# Patient Record
Sex: Male | Born: 1980 | Race: White | Hispanic: No | Marital: Married | Smoking: Never smoker
Health system: Southern US, Community
[De-identification: ages and names within clinical notes are randomized; demographics above are authoritative.]

## PROBLEM LIST (undated history)

## (undated) DIAGNOSIS — M419 Scoliosis, unspecified: Secondary | ICD-10-CM

## (undated) DIAGNOSIS — Z91018 Allergy to other foods: Secondary | ICD-10-CM

## (undated) DIAGNOSIS — J302 Other seasonal allergic rhinitis: Secondary | ICD-10-CM

## (undated) DIAGNOSIS — J45909 Unspecified asthma, uncomplicated: Secondary | ICD-10-CM

## (undated) HISTORY — PX: TYMPANOSTOMY TUBE PLACEMENT: SHX32

## (undated) HISTORY — PX: BACK SURGERY: SHX140

## (undated) HISTORY — PX: WISDOM TOOTH EXTRACTION: SHX21

---

## 2014-01-21 ENCOUNTER — Encounter (HOSPITAL_COMMUNITY): Payer: Self-pay | Admitting: Emergency Medicine

## 2014-01-21 ENCOUNTER — Emergency Department (HOSPITAL_COMMUNITY)
Admission: EM | Admit: 2014-01-21 | Discharge: 2014-01-21 | Disposition: A | Discharge status: Home / Self Care | Payer: BC Managed Care – PPO | Attending: Emergency Medicine | Admitting: Emergency Medicine

## 2014-01-21 DIAGNOSIS — Y929 Unspecified place or not applicable: Secondary | ICD-10-CM | POA: Insufficient documentation

## 2014-01-21 DIAGNOSIS — X088XXA Exposure to other specified smoke, fire and flames, initial encounter: Secondary | ICD-10-CM | POA: Insufficient documentation

## 2014-01-21 DIAGNOSIS — Z79899 Other long term (current) drug therapy: Secondary | ICD-10-CM | POA: Insufficient documentation

## 2014-01-21 DIAGNOSIS — T23202A Burn of second degree of left hand, unspecified site, initial encounter: Secondary | ICD-10-CM

## 2014-01-21 DIAGNOSIS — Y9389 Activity, other specified: Secondary | ICD-10-CM | POA: Insufficient documentation

## 2014-01-21 DIAGNOSIS — T23209A Burn of second degree of unspecified hand, unspecified site, initial encounter: Secondary | ICD-10-CM | POA: Insufficient documentation

## 2014-01-21 DIAGNOSIS — J45909 Unspecified asthma, uncomplicated: Secondary | ICD-10-CM | POA: Insufficient documentation

## 2014-01-21 HISTORY — DX: Other seasonal allergic rhinitis: J30.2

## 2014-01-21 HISTORY — DX: Allergy to other foods: Z91.018

## 2014-01-21 HISTORY — DX: Scoliosis, unspecified: M41.9

## 2014-01-21 HISTORY — DX: Unspecified asthma, uncomplicated: J45.909

## 2014-01-21 MED ORDER — SILVER SULFADIAZINE 1 % EX CREA
TOPICAL_CREAM | Freq: Two times a day (BID) | CUTANEOUS | Status: DC
  Administered 2014-01-21: 05:00:00 via TOPICAL
  Filled 2014-01-21: qty 85

## 2014-01-21 MED ORDER — HYDROCODONE-ACETAMINOPHEN 5-325 MG PO TABS: 1.0000 | ORAL_TABLET | ORAL | Status: DC | PRN

## 2014-01-21 NOTE — Discharge Instructions (Signed)
Use topical antibiotics and lotion as discussed.  If you were given medicines take as directed.  If you are on coumadin or contraceptives realize their levels and effectiveness is altered by many different medicines.  If you have any reaction (rash, tongues swelling, other) to the medicines stop taking and see a physician.   Please follow up as directed and return to the ER or see a physician for new or worsening symptoms.  Thank you. Filed Vitals:   01/21/14 0102 01/21/14 0330  BP: 130/96 121/79  Pulse: 75 67  Temp: 98.6 F (37 C)   TempSrc: Oral   Resp: 18   SpO2: 99% 100%    Burn Care Burns hurt your skin. When your skin is hurt, it is easier to get an infection. Follow your doctor's directions to help prevent an infection. HOME CARE  Wash your hands well before you change your bandage.  Change your bandage as often as told by your doctor.  Remove the old bandage. If the bandage sticks, soak it off with cool, clean water.  Gently clean the burn with mild soap and water.  Pat the burn dry with a clean, dry cloth.  Put a thin layer of medicated cream on the burn.  Put a clean bandage on as told by your doctor.  Keep the bandage clean and dry.  Raise (elevate) the burn for the first 24 hours. After that, follow your doctor's directions.  Only take medicine as told by your doctor. GET HELP RIGHT AWAY IF:   You have too much pain.  The skin near the burn is red, tender, puffy (swollen), or has red streaks.  The burn area has yellowish white fluid (pus) or a bad smell coming from it.  You have a fever. MAKE SURE YOU:   Understand these instructions.  Will watch your condition.  Will get help right away if you are not doing well or get worse. Document Released: 05/23/2008 Document Revised: 11/06/2011 Document Reviewed: 01/04/2011 [facility] Patient Information 2014 [place], [place].
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
## 2017-04-09 DIAGNOSIS — J4530 Mild persistent asthma, uncomplicated: Secondary | ICD-10-CM | POA: Diagnosis not present

## 2017-06-18 DIAGNOSIS — R6889 Other general symptoms and signs: Secondary | ICD-10-CM | POA: Diagnosis not present

## 2017-06-18 DIAGNOSIS — Z20828 Contact with and (suspected) exposure to other viral communicable diseases: Secondary | ICD-10-CM | POA: Diagnosis not present

## 2017-06-18 DIAGNOSIS — J4530 Mild persistent asthma, uncomplicated: Secondary | ICD-10-CM | POA: Diagnosis not present

## 2017-07-24 DIAGNOSIS — Z23 Encounter for immunization: Secondary | ICD-10-CM | POA: Diagnosis not present

## 2017-09-17 DIAGNOSIS — J0100 Acute maxillary sinusitis, unspecified: Secondary | ICD-10-CM | POA: Diagnosis not present

## 2017-09-17 DIAGNOSIS — J209 Acute bronchitis, unspecified: Secondary | ICD-10-CM | POA: Diagnosis not present

## 2018-01-28 DIAGNOSIS — J309 Allergic rhinitis, unspecified: Secondary | ICD-10-CM | POA: Diagnosis not present

## 2018-01-28 DIAGNOSIS — Z0000 Encounter for general adult medical examination without abnormal findings: Secondary | ICD-10-CM | POA: Diagnosis not present

## 2018-01-28 DIAGNOSIS — E782 Mixed hyperlipidemia: Secondary | ICD-10-CM | POA: Diagnosis not present

## 2018-01-28 DIAGNOSIS — E041 Nontoxic single thyroid nodule: Secondary | ICD-10-CM | POA: Diagnosis not present

## 2018-01-28 DIAGNOSIS — L989 Disorder of the skin and subcutaneous tissue, unspecified: Secondary | ICD-10-CM | POA: Diagnosis not present

## 2018-01-30 ENCOUNTER — Other Ambulatory Visit: Payer: Self-pay | Admitting: Family Medicine

## 2018-01-30 DIAGNOSIS — E041 Nontoxic single thyroid nodule: Secondary | ICD-10-CM

## 2018-02-06 ENCOUNTER — Ambulatory Visit
Admission: RE | Admit: 2018-02-06 | Discharge: 2018-02-06 | Disposition: A | Discharge status: Home / Self Care | Payer: BLUE CROSS/BLUE SHIELD | Source: Ambulatory Visit | Attending: Family Medicine | Admitting: Family Medicine

## 2018-02-06 DIAGNOSIS — E041 Nontoxic single thyroid nodule: Secondary | ICD-10-CM | POA: Diagnosis not present

## 2019-02-13 DIAGNOSIS — J4530 Mild persistent asthma, uncomplicated: Secondary | ICD-10-CM | POA: Diagnosis not present

## 2019-02-13 DIAGNOSIS — J309 Allergic rhinitis, unspecified: Secondary | ICD-10-CM | POA: Diagnosis not present

## 2019-02-13 DIAGNOSIS — L989 Disorder of the skin and subcutaneous tissue, unspecified: Secondary | ICD-10-CM | POA: Diagnosis not present

## 2019-05-31 DIAGNOSIS — Z23 Encounter for immunization: Secondary | ICD-10-CM | POA: Diagnosis not present

## 2019-06-13 DIAGNOSIS — D485 Neoplasm of uncertain behavior of skin: Secondary | ICD-10-CM | POA: Diagnosis not present

## 2019-06-13 DIAGNOSIS — D225 Melanocytic nevi of trunk: Secondary | ICD-10-CM | POA: Diagnosis not present

## 2019-06-13 DIAGNOSIS — L821 Other seborrheic keratosis: Secondary | ICD-10-CM | POA: Diagnosis not present

## 2019-06-13 DIAGNOSIS — D2239 Melanocytic nevi of other parts of face: Secondary | ICD-10-CM | POA: Diagnosis not present

## 2019-06-13 DIAGNOSIS — L814 Other melanin hyperpigmentation: Secondary | ICD-10-CM | POA: Diagnosis not present

## 2020-04-11 IMAGING — US US THYROID
1 series · 14 of 25 positions shown · non-contrast
Comparison: None.

CLINICAL DATA: Thyroid nodule on exam

EXAM:
THYROID ULTRASOUND
TECHNIQUE: Ultrasound examination of the thyroid gland and adjacent soft
tissues was performed.

[Series 1: us thyroid · 0.07mm/px · 14 of 53 slices shown]
[im 1/53]
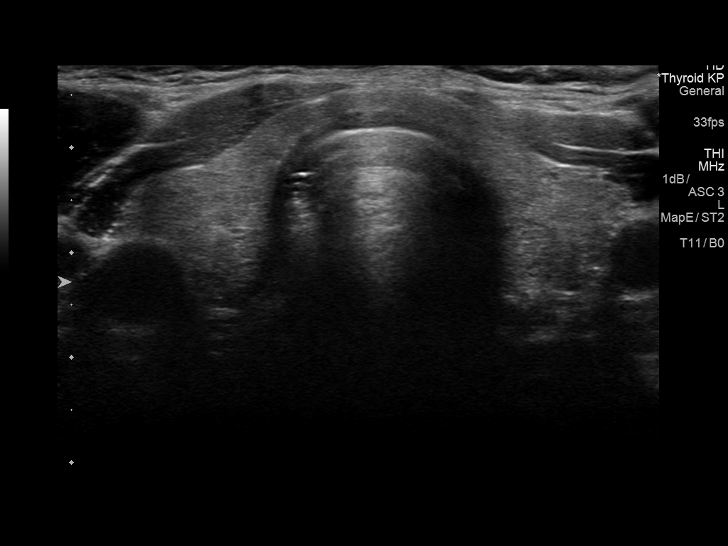
[im 5/53]
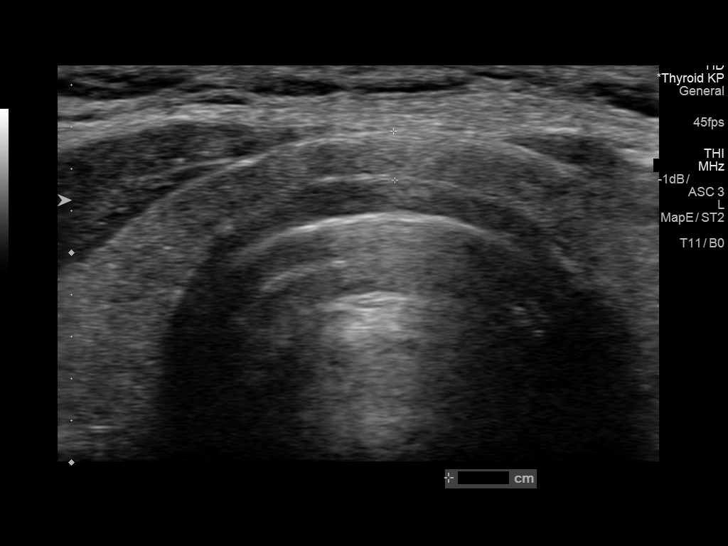
[im 9/53]
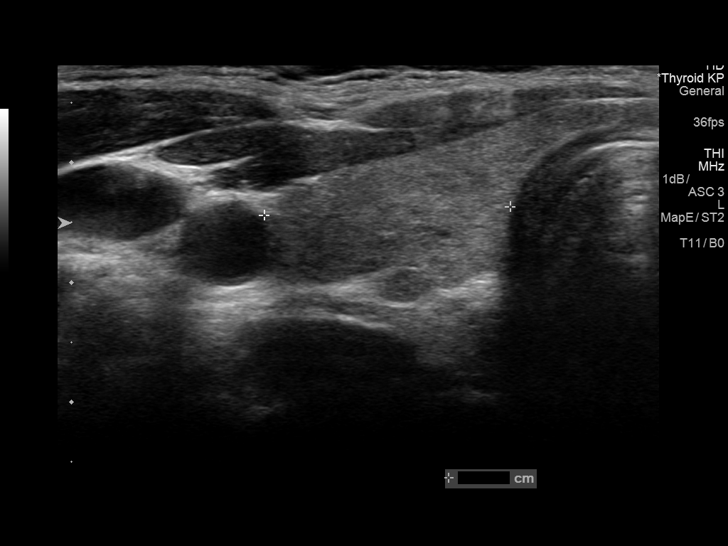
[im 14/53]
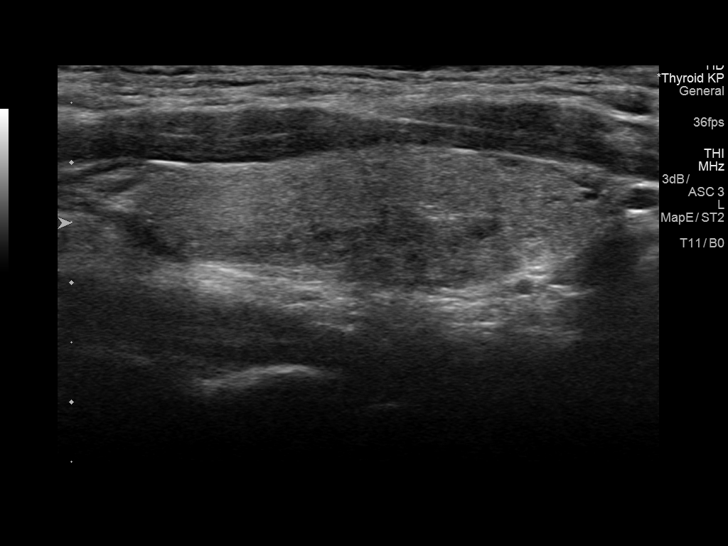
[im 18/53]
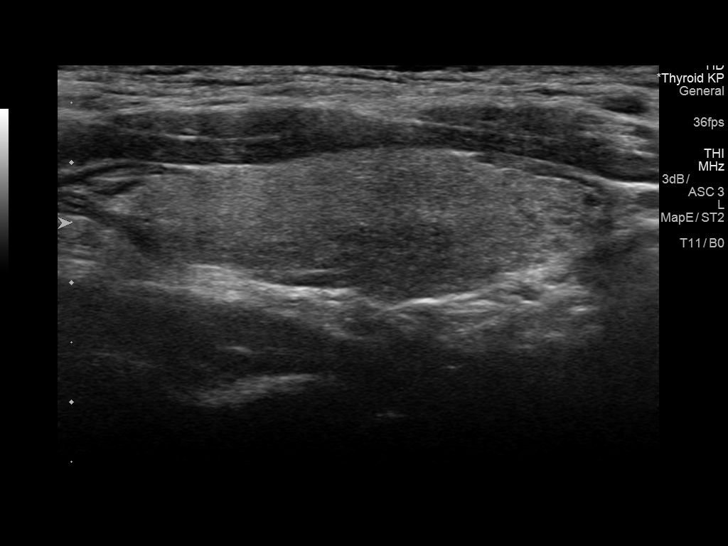
[im 20/53]
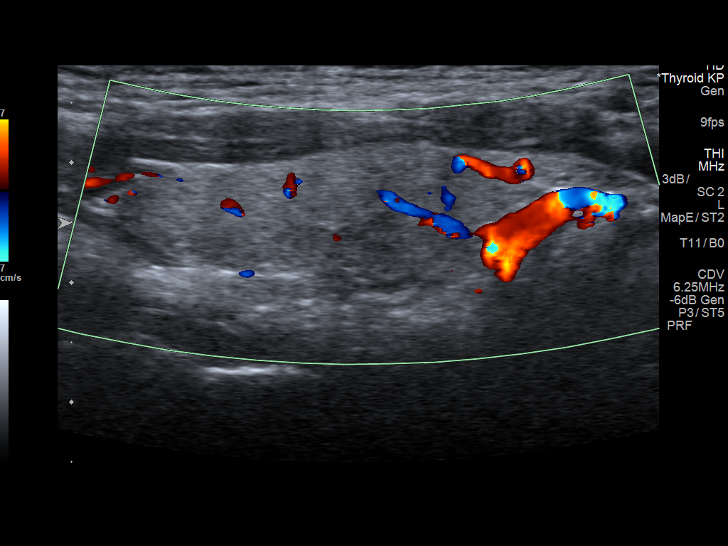
[im 24/53]
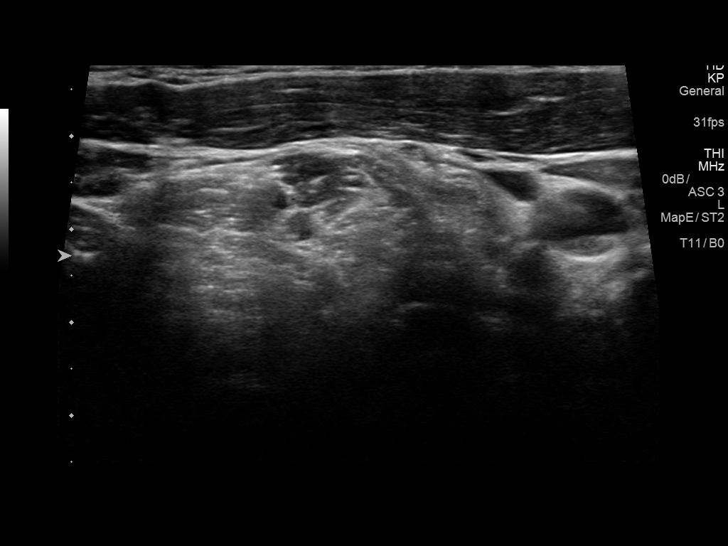
[im 29/53]
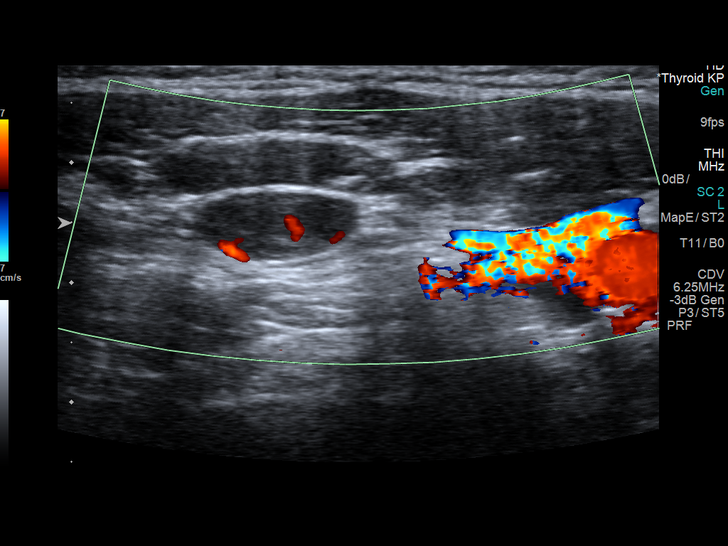
[im 33/53]
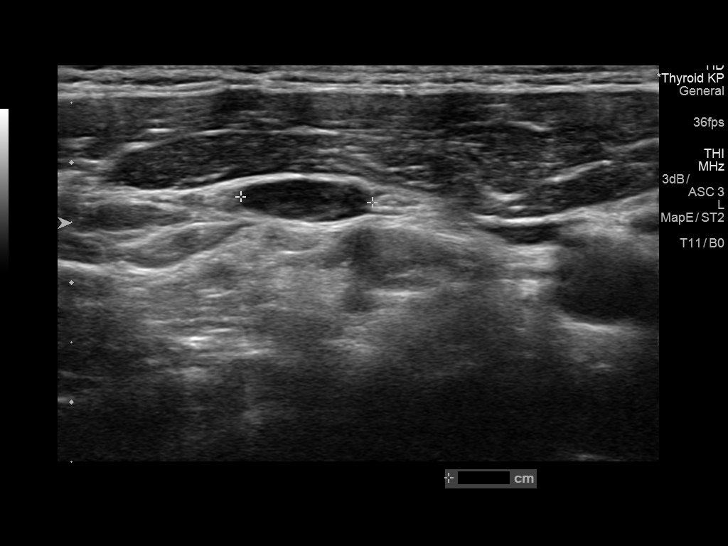
[im 35/53]
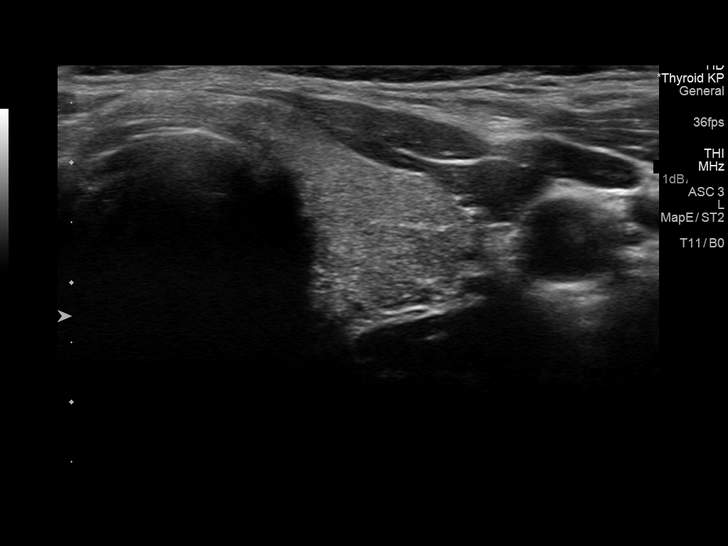
[im 40/53]
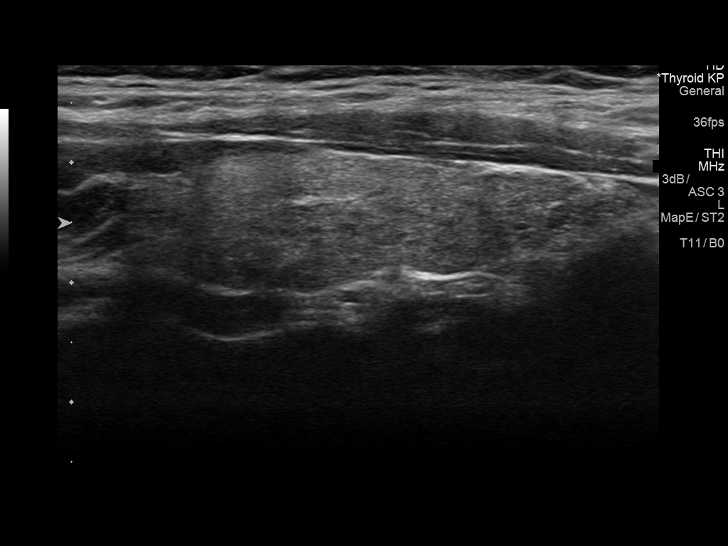
[im 44/53]
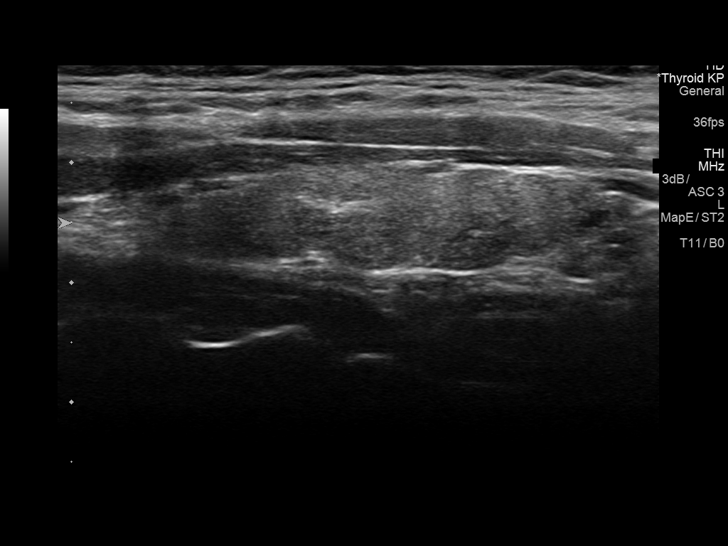
[im 48/53]
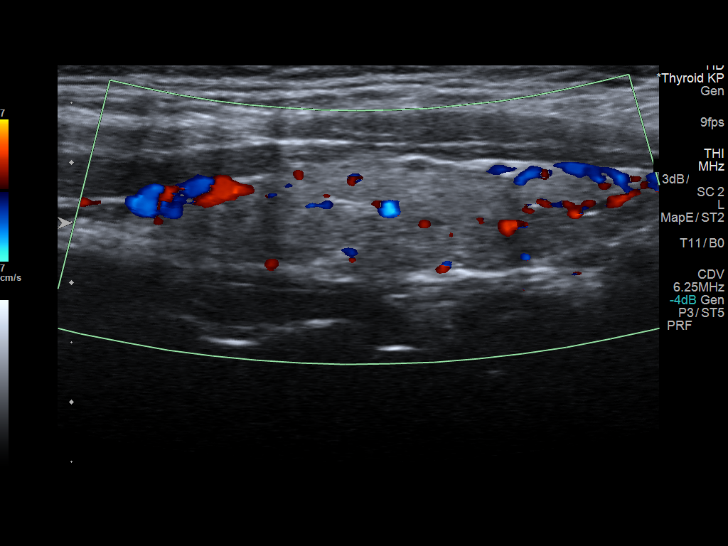
[im 53/53]
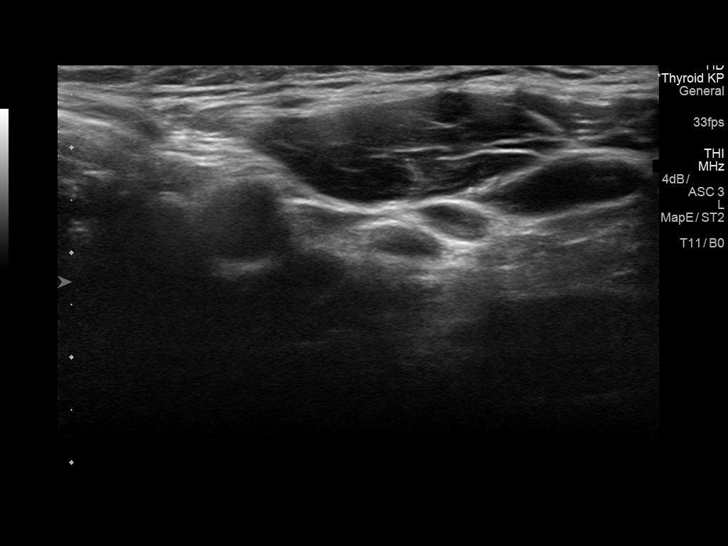

[14 of 25 positions shown; findings below may reference images not displayed]

FINDINGS: Parenchymal Echotexture: Mildly heterogenous

Isthmus: 2 mm

Right lobe: 4.1 x 1.3 x 2.1 cm

Left lobe: 4.3 x 1.2 x 1.5 cm

_________________________________________________________

Estimated total number of nodules >/= 1 cm: 0

Number of spongiform nodules >/=  2 cm not described below (TR1): 0

Number of mixed cystic and solid nodules >/= 1.5 cm not described
below (TR2): 0

_________________________________________________________

Minor gland heterogeneity, nonspecific. No discrete nodule or focal
abnormality. Normal vascularity. Surrounding lymph nodes are normal
in appearance. No adenopathy.
IMPRESSION: Minor thyroid heterogeneity, nonspecific.

The above is in keeping with the ACR TI-RADS recommendations - [HOSPITAL] 3624;[DATE].

## 2020-06-16 DIAGNOSIS — Z23 Encounter for immunization: Secondary | ICD-10-CM | POA: Diagnosis not present

## 2020-10-19 DIAGNOSIS — Z0000 Encounter for general adult medical examination without abnormal findings: Secondary | ICD-10-CM | POA: Diagnosis not present

## 2020-10-19 DIAGNOSIS — E782 Mixed hyperlipidemia: Secondary | ICD-10-CM | POA: Diagnosis not present

## 2020-10-19 DIAGNOSIS — K219 Gastro-esophageal reflux disease without esophagitis: Secondary | ICD-10-CM | POA: Diagnosis not present

## 2020-10-19 DIAGNOSIS — J4530 Mild persistent asthma, uncomplicated: Secondary | ICD-10-CM | POA: Diagnosis not present

## 2020-12-03 DIAGNOSIS — J019 Acute sinusitis, unspecified: Secondary | ICD-10-CM | POA: Diagnosis not present

## 2020-12-03 DIAGNOSIS — J4531 Mild persistent asthma with (acute) exacerbation: Secondary | ICD-10-CM | POA: Diagnosis not present

## 2020-12-07 DIAGNOSIS — Z3009 Encounter for other general counseling and advice on contraception: Secondary | ICD-10-CM | POA: Diagnosis not present

## 2021-06-22 DIAGNOSIS — Z03818 Encounter for observation for suspected exposure to other biological agents ruled out: Secondary | ICD-10-CM | POA: Diagnosis not present

## 2021-06-22 DIAGNOSIS — J069 Acute upper respiratory infection, unspecified: Secondary | ICD-10-CM | POA: Diagnosis not present

## 2021-06-22 DIAGNOSIS — M791 Myalgia, unspecified site: Secondary | ICD-10-CM | POA: Diagnosis not present

## 2021-06-22 DIAGNOSIS — R059 Cough, unspecified: Secondary | ICD-10-CM | POA: Diagnosis not present

## 2021-10-22 ENCOUNTER — Emergency Department (HOSPITAL_COMMUNITY): Payer: BC Managed Care – PPO | Admitting: Anesthesiology

## 2021-10-22 ENCOUNTER — Encounter (HOSPITAL_COMMUNITY): Payer: Self-pay | Admitting: Emergency Medicine

## 2021-10-22 ENCOUNTER — Ambulatory Visit (HOSPITAL_COMMUNITY)
Admission: EM | Admit: 2021-10-22 | Discharge: 2021-10-22 | Disposition: A | Discharge status: Home / Self Care | Payer: BC Managed Care – PPO | Attending: Emergency Medicine | Admitting: Emergency Medicine

## 2021-10-22 ENCOUNTER — Encounter (HOSPITAL_COMMUNITY)
Admission: EM | Disposition: A | Discharge status: Home / Self Care | Payer: Self-pay | Source: Home / Self Care | Attending: Emergency Medicine

## 2021-10-22 ENCOUNTER — Other Ambulatory Visit: Payer: Self-pay

## 2021-10-22 ENCOUNTER — Emergency Department (HOSPITAL_COMMUNITY): Payer: BC Managed Care – PPO

## 2021-10-22 DIAGNOSIS — Z20822 Contact with and (suspected) exposure to covid-19: Secondary | ICD-10-CM | POA: Insufficient documentation

## 2021-10-22 DIAGNOSIS — J45909 Unspecified asthma, uncomplicated: Secondary | ICD-10-CM | POA: Insufficient documentation

## 2021-10-22 DIAGNOSIS — K319 Disease of stomach and duodenum, unspecified: Secondary | ICD-10-CM | POA: Diagnosis not present

## 2021-10-22 DIAGNOSIS — K56699 Other intestinal obstruction unspecified as to partial versus complete obstruction: Secondary | ICD-10-CM

## 2021-10-22 DIAGNOSIS — Z79899 Other long term (current) drug therapy: Secondary | ICD-10-CM | POA: Insufficient documentation

## 2021-10-22 DIAGNOSIS — T18108A Unspecified foreign body in esophagus causing other injury, initial encounter: Secondary | ICD-10-CM | POA: Diagnosis not present

## 2021-10-22 DIAGNOSIS — K2950 Unspecified chronic gastritis without bleeding: Secondary | ICD-10-CM | POA: Diagnosis not present

## 2021-10-22 DIAGNOSIS — K2289 Other specified disease of esophagus: Secondary | ICD-10-CM | POA: Diagnosis not present

## 2021-10-22 DIAGNOSIS — T18128A Food in esophagus causing other injury, initial encounter: Secondary | ICD-10-CM | POA: Insufficient documentation

## 2021-10-22 DIAGNOSIS — X58XXXA Exposure to other specified factors, initial encounter: Secondary | ICD-10-CM | POA: Diagnosis not present

## 2021-10-22 DIAGNOSIS — K2970 Gastritis, unspecified, without bleeding: Secondary | ICD-10-CM | POA: Diagnosis not present

## 2021-10-22 DIAGNOSIS — K200 Eosinophilic esophagitis: Secondary | ICD-10-CM | POA: Diagnosis not present

## 2021-10-22 DIAGNOSIS — K209 Esophagitis, unspecified without bleeding: Secondary | ICD-10-CM | POA: Diagnosis not present

## 2021-10-22 DIAGNOSIS — R1310 Dysphagia, unspecified: Secondary | ICD-10-CM | POA: Diagnosis not present

## 2021-10-22 DIAGNOSIS — K219 Gastro-esophageal reflux disease without esophagitis: Secondary | ICD-10-CM | POA: Diagnosis not present

## 2021-10-22 HISTORY — PX: BIOPSY: SHX5522

## 2021-10-22 HISTORY — PX: FOREIGN BODY REMOVAL: SHX962

## 2021-10-22 HISTORY — PX: ESOPHAGOGASTRODUODENOSCOPY (EGD) WITH PROPOFOL: SHX5813

## 2021-10-22 LAB — RESP PANEL BY RT-PCR (FLU A&B, COVID) ARPGX2
Influenza A by PCR: NEGATIVE
Influenza B by PCR: NEGATIVE
SARS Coronavirus 2 by RT PCR: NEGATIVE

## 2021-10-22 SURGERY — ESOPHAGOGASTRODUODENOSCOPY (EGD) WITH PROPOFOL
Anesthesia: General

## 2021-10-22 MED ORDER — PANTOPRAZOLE SODIUM 40 MG PO TBEC: 40.0000 mg | DELAYED_RELEASE_TABLET | Freq: Two times a day (BID) | ORAL | 2 refills | Status: AC

## 2021-10-22 MED ORDER — LIDOCAINE HCL (CARDIAC) PF 100 MG/5ML IV SOSY
PREFILLED_SYRINGE | INTRAVENOUS | Status: DC | PRN
  Administered 2021-10-22: 60 mg via INTRAVENOUS

## 2021-10-22 MED ORDER — SODIUM CHLORIDE 0.9 % IV SOLN: INTRAVENOUS | Status: DC

## 2021-10-22 MED ORDER — ACETAMINOPHEN 10 MG/ML IV SOLN
1000.0000 mg | Freq: Once | INTRAVENOUS | Status: DC | PRN
  Filled 2021-10-22: qty 100

## 2021-10-22 MED ORDER — SUCCINYLCHOLINE CHLORIDE 200 MG/10ML IV SOSY
PREFILLED_SYRINGE | INTRAVENOUS | Status: DC | PRN
  Administered 2021-10-22: 100 mg via INTRAVENOUS

## 2021-10-22 MED ORDER — FENTANYL CITRATE (PF) 100 MCG/2ML IJ SOLN: 25.0000 ug | INTRAMUSCULAR | Status: DC | PRN

## 2021-10-22 MED ORDER — PROPOFOL 10 MG/ML IV BOLUS
INTRAVENOUS | Status: DC | PRN
  Administered 2021-10-22: 200 mg via INTRAVENOUS

## 2021-10-22 MED ORDER — ONDANSETRON HCL 4 MG/2ML IJ SOLN
INTRAMUSCULAR | Status: DC | PRN
  Administered 2021-10-22: 4 mg via INTRAVENOUS

## 2021-10-22 MED ORDER — LACTATED RINGERS IV SOLN
INTRAVENOUS | Status: AC | PRN
  Administered 2021-10-22: 20 mL/h via INTRAVENOUS

## 2021-10-22 MED ORDER — LACTATED RINGERS IV SOLN: INTRAVENOUS | Status: DC | PRN

## 2021-10-22 SURGICAL SUPPLY — 15 items

## 2021-10-22 NOTE — ED Provider Notes (Signed)
[facility] EMERGENCY DEPARTMENT Provider Note   CSN: [id number] Arrival date & time: 10/22/21  1430     History  Chief Complaint  Patient presents with   Food bolus    Raffael Bugarin is a 41 y.o. male with history significant for food bolus impaction requiring endoscopy.  Patient presents to ED for evaluation of food bolus that he noticed around 12:45 PM while he was eating lunch that included [place] broil and mashed potatoes.  Patient reportedly unable to swallow secretions now.  Patient states that he has a history of food boluses, he was last seen by GI specialist in late 2000's but is unable to specify date.  Patient unable to tell me which GI doctor he was seen by.  Patient endorsing choking, trouble swallowing, drooling, food bolus.  Patient denies nausea, vomiting, abdominal pain, trouble breathing.  HPI     Home Medications Prior to Admission medications   Medication Sig Start Date End Date Taking? Authorizing Provider  albuterol (PROAIR HFA) 108 (90 BASE) MCG/ACT inhaler Inhale 1 puff into the lungs every 4 (four) hours as needed for wheezing or shortness of breath.    [provider]  clobetasol ointment (TEMOVATE) 0.05 % Apply 1 application topically 2 (two) times daily as needed (cuts or raashes to hands).    [provider]  HYDROcodone-acetaminophen (NORCO) 5-325 MG per tablet Take 1-2 tablets by mouth every 4 (four) hours as needed. 01/21/14   Blane Ohara, MD  ibuprofen (ADVIL,MOTRIN) 200 MG tablet Take 200-400 mg by mouth every 6 (six) hours as needed for moderate pain.    [provider]  mineral oil-hydrophilic petrolatum (AQUAPHOR) ointment Apply 1 application topically 2 (two) times daily as needed for dry skin.    [provider]      Allergies    Patient has no known allergies.    Review of Systems   Review of Systems  HENT:  Positive for drooling and trouble swallowing.   Respiratory:  Positive for  choking. Negative for shortness of breath.   Gastrointestinal:  Negative for nausea and vomiting.  All other systems reviewed and are negative.  Physical Exam Updated Vital Signs BP (!) 149/109    Pulse 88    Temp 98.4 F (36.9 C) (Oral)    Resp 14    Ht 5\' 4"  (1.626 m)    Wt 74.8 kg    SpO2 100%    BMI 28.32 kg/m  Physical Exam Vitals and nursing note reviewed.  Constitutional:      General: He is not in acute distress.    Appearance: He is not ill-appearing, toxic-appearing or diaphoretic.  HENT:     Head: Normocephalic and atraumatic.     Nose: Nose normal.     Mouth/Throat:     Mouth: Mucous membranes are moist.  Eyes:     Extraocular Movements: Extraocular movements intact.     Conjunctiva/sclera: Conjunctivae normal.     Pupils: Pupils are equal, round, and reactive to light.  Cardiovascular:     Rate and Rhythm: Normal rate and regular rhythm.  Pulmonary:     Effort: Pulmonary effort is normal.     Breath sounds: Normal breath sounds. No wheezing.     Comments: Patient airway patent Abdominal:     General: Abdomen is flat. Bowel sounds are normal. There is no distension.     Palpations: Abdomen is soft.     Tenderness: There is no abdominal tenderness.  Musculoskeletal:  General: Normal range of motion.     Cervical back: Normal range of motion. No rigidity or tenderness.  Skin:    General: Skin is warm and dry.     Capillary Refill: Capillary refill takes less than 2 seconds.  Neurological:     General: No focal deficit present.     Mental Status: He is alert and oriented to person, place, and time.    ED Results / Procedures / Treatments   Labs (all labs ordered are listed, but only abnormal results are displayed) Labs Reviewed  RESP PANEL BY RT-PCR (FLU A&B, COVID) [id number]    EKG None  Radiology DG Neck Soft Tissue  Result Date: 10/22/2021 CLINICAL DATA:  Pt states he was eating [place] Broil and mashed potatoes around 12:45 and has food stuck in  esophagus. Hx of same. Pt unable to swallow secretions EXAM: NECK SOFT TISSUES - 1+ VIEW COMPARISON:  None. FINDINGS: There is no evidence of retropharyngeal soft tissue swelling or epiglottic enlargement. The cervical airway is unremarkable and no radio-opaque foreign body identified. IMPRESSION: Negative. Electronically Signed   By: Amie Portland M.D.   On: 10/22/2021 15:37    Procedures Procedures    Medications Ordered in ED Medications - No data to display  ED Course/ Medical Decision Making/ A&P                           Medical Decision Making Amount and/or Complexity of Data Reviewed Labs: ordered. Radiology: ordered.   41 year old male presents ED for evaluation of food bolus.  On examination, the patient is afebrile, nontachycardic, not hypoxic, clear lung sounds bilaterally, showing ability to handle secretions appropriately, soft compressible abdomen.  The patient is nontoxic in appearance.  Plain film imaging of soft neck tissues does show food bolus impacted/lodged in patient esophagus.  Patient reporting that he feels a pressure in his esophagus currently.  He is not complaining of nausea or vomiting.  Patient has history of same and states he was scoped in the past by [facility] GI doctor.  Due to this, I have consulted with [facility] for further management.  I am awaiting a callback.   I anticipate this patient needing admission for possible endoscopy, have placed basic lab orders and COVID swab.  At the end of shift, GI has not returned my call.  This patient will be signed out to Zettie Pho, PA-C for further management.   Final Clinical Impression(s) / ED Diagnoses Final diagnoses:  Food bolus obstruction of intestine [facility])    Rx / DC Orders ED Discharge Orders     None         Clent Ridges 10/22/21 1604    Cheryll Cockayne, MD 10/23/21 1949

## 2021-10-22 NOTE — Consult Note (Signed)
Referring Provider:  EDP Primary Care Physician:  Merri Brunette, MD Primary Gastroenterologist: Deboraha Sprang primary  Reason for Consultation: Impaction  HPI: David Stevens is a 41 y.o. male with past medical history of GERD, food allergies and history of dysphagia presented to the emergency department with possible food impaction.  According to patient he was eating lunch this afternoon and felt solid food getting stuck in midesophagus.  Not able to eat or drink since then.  Similar episode more than 20 years ago requiring EGD and dilation according to patient.  He has been having intermittent dysphagia with sensation of food getting stuck few times a week.  Takes over-the-counter antiacid medicines as needed.  Past Medical History:  Diagnosis Date   Asthma    Food allergy    Scoliosis    Seasonal allergies     Past Surgical History:  Procedure Laterality Date   BACK SURGERY     TYMPANOSTOMY TUBE PLACEMENT     WISDOM TOOTH EXTRACTION      Prior to Admission medications   Medication Sig Start Date End Date Taking? Authorizing Provider  albuterol (PROAIR HFA) 108 (90 BASE) MCG/ACT inhaler Inhale 1 puff into the lungs every 4 (four) hours as needed for wheezing or shortness of breath.    [provider]  clobetasol ointment (TEMOVATE) 0.05 % Apply 1 application topically 2 (two) times daily as needed (cuts or raashes to hands).    [provider]  HYDROcodone-acetaminophen (NORCO) 5-325 MG per tablet Take 1-2 tablets by mouth every 4 (four) hours as needed. 01/21/14   Blane Ohara, MD  ibuprofen (ADVIL,MOTRIN) 200 MG tablet Take 200-400 mg by mouth every 6 (six) hours as needed for moderate pain.    [provider]  mineral oil-hydrophilic petrolatum (AQUAPHOR) ointment Apply 1 application topically 2 (two) times daily as needed for dry skin.    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 No pertinent family history.  Social History   Socioeconomic History   Marital status: Married    Spouse name: Not on file   Number of children: Not on file   Years of education: Not on file   Highest education level: Not on file  Occupational History   Not on file  Tobacco Use   Smoking status: Never   Smokeless tobacco: Not on file  Substance and Sexual Activity   Alcohol use: No   Drug use: No   Sexual activity: Not on file  Other Topics Concern   Not on file  Social History Narrative   Not on file   Social Determinants of Health   Financial Resource Strain: Not on file  Food Insecurity: Not on file  Transportation Needs: Not on file  Physical Activity: Not on file  Stress: Not on file  Social Connections: Not on file  Intimate Partner Violence: Not on file    Review of Systems: All negative except as stated above in HPI.  Physical Exam: Vital signs: Vitals:   10/22/21 1434 10/22/21 1703  BP: (!) 149/109 (!) 155/99  Pulse: 88 82  Resp: 14 16  Temp: 98.4 F (36.9 C) 98.4 F (36.9 C)  SpO2: 100% 98%  Physical Exam Vitals reviewed.  Constitutional:      General: He is not in acute distress.    Appearance: Normal appearance.  HENT:     Head: Normocephalic and atraumatic.     Nose: Nose normal.  Eyes:     General: No scleral icterus.    Extraocular Movements: Extraocular movements intact.  Cardiovascular:     Rate and Rhythm: Normal rate and regular rhythm.     Heart sounds: No murmur heard. Pulmonary:     Effort: Pulmonary effort is normal. No respiratory distress.     Breath sounds: Normal breath sounds.  Abdominal:     General: Bowel sounds are normal. There is no distension.      Palpations: Abdomen is soft.     Tenderness: There is no abdominal tenderness.     Hernia: No hernia is present.  Musculoskeletal:        General: No swelling or tenderness. Normal range of motion.  Skin:    General: Skin is warm.     Coloration: Skin is not jaundiced.  Neurological:     Mental Status: He is alert and oriented to person, place, and time.  Psychiatric:        Mood and Affect: Mood normal.        Behavior: Behavior normal.     GI:  Lab Results: No results for input(s): WBC, HGB, HCT, PLT in the last 72 hours. BMET No results for input(s): NA, K, CL, CO2, GLUCOSE, BUN, CREATININE, CALCIUM in the last 72 hours. LFT No results for input(s): PROT, ALBUMIN, AST, ALT, ALKPHOS, BILITOT, BILIDIR, IBILI in the last 72 hours. PT/INR No results for input(s): LABPROT, INR in the last 72 hours.   Studies/Results: DG Neck Soft Tissue  Result Date: 10/22/2021 CLINICAL DATA:  Pt states he was eating [place] Broil and mashed potatoes around 12:45 and has food stuck in esophagus. Hx of same. Pt unable to swallow secretions EXAM: NECK SOFT TISSUES - 1+ VIEW COMPARISON:  None. FINDINGS: There is no evidence of retropharyngeal soft tissue swelling or epiglottic enlargement. The cervical airway is unremarkable and no radio-opaque foreign body identified. IMPRESSION: Negative. Electronically Signed   By: Amie Portland M.D.   On: 10/22/2021 15:37    Impression/Plan: -Food impaction -Intermittent dysphagia.  Most likely lower esophageal ring. -Chronic GERD  Recommendations -------------------------- -Urgent EGD today.  Risks (bleeding, infection, bowel perforation that could require surgery, sedation-related changes in cardiopulmonary systems), benefits (identification and possible treatment of source of symptoms, exclusion of certain causes of symptoms), and alternatives (watchful waiting, radiographic imaging studies, empiric medical treatment)  were explained to patient/family in  detail and patient wishes to proceed.     LOS: 0 days   Kathi Der  MD, FACP 10/22/2021, 5:22 PM  Contact #  [phone]

## 2021-10-22 NOTE — ED Notes (Signed)
Pt isn't sure but he noticed after his abdominal exam he can now pass saliva down

## 2021-10-22 NOTE — Transfer of Care (Signed)
Immediate Anesthesia Transfer of Care Note  Patient: David Stevens  Procedure(s) Performed: ESOPHAGOGASTRODUODENOSCOPY (EGD) WITH PROPOFOL FOREIGN BODY REMOVAL BIOPSY  Patient Location: PACU  Anesthesia Type:General  Level of Consciousness: awake  Airway & Oxygen Therapy: Patient Spontanous Breathing  Post-op Assessment: Report given to RN and Post -op Vital signs reviewed and stable  Post vital signs: Reviewed and stable  Last Vitals:  Vitals Value Taken Time  BP 107/89 10/22/21 1756  Temp    Pulse 93 10/22/21 1759  Resp 19 10/22/21 1759  SpO2 96 % 10/22/21 1759  Vitals shown include unvalidated device data.  Last Pain:  Vitals:   10/22/21 1703  TempSrc: Temporal  PainSc: 0-No pain         Complications: No notable events documented.

## 2021-10-22 NOTE — Anesthesia Preprocedure Evaluation (Addendum)
Anesthesia Evaluation  Patient identified by MRN, date of birth, ID band Patient awake    Reviewed: Allergy & Precautions, NPO status , Patient's Chart, lab work & pertinent test results  Airway Mallampati: II  TM Distance: >3 FB Neck ROM: Full    Dental  (+) Teeth Intact   Pulmonary asthma ,    Pulmonary exam normal        Cardiovascular negative cardio ROS   Rhythm:Regular Rate:Normal     Neuro/Psych negative neurological ROS  negative psych ROS   GI/Hepatic Neg liver ROS, Food impaction   Endo/Other  negative endocrine ROS  Renal/GU negative Renal ROS  negative genitourinary   Musculoskeletal negative musculoskeletal ROS (+)   Abdominal Normal abdominal exam  (+)   Peds  Hematology negative hematology ROS (+)   Anesthesia Other Findings   Reproductive/Obstetrics                            Anesthesia Physical Anesthesia Plan  ASA: 2 and emergent  Anesthesia Plan: General   Post-op Pain Management:    Induction: Intravenous and Rapid sequence  PONV Risk Score and Plan: 2 and Ondansetron, Dexamethasone and Treatment may vary due to age or medical condition  Airway Management Planned: Mask and Oral ETT  Additional Equipment: None  Intra-op Plan:   Post-operative Plan: Extubation in OR  Informed Consent: I have reviewed the patients History and Physical, chart, labs and discussed the procedure including the risks, benefits and alternatives for the proposed anesthesia with the patient or authorized representative who has indicated his/her understanding and acceptance.     Dental advisory given  Plan Discussed with: CRNA  Anesthesia Plan Comments:        Anesthesia Quick Evaluation

## 2021-10-22 NOTE — ED Triage Notes (Signed)
Pt states he was eating [place] Broil and mashed potatoes around 12:45 and has food stuck in esophagus.  Hx of same.  Pt unable to swallow secretions.

## 2021-10-22 NOTE — Op Note (Signed)
[facility] Patient Name: David Stevens Procedure Date : 10/22/2021 MRN: [id number] Attending MD: Kathi Der , MD Date of Birth: Apr 19, 1981 CSN: [id number] Age: 41 Admit Type: Outpatient Procedure:                Upper GI endoscopy Indications:              Dysphagia, Foreign body in the esophagus Providers:                Kathi Der, MD, Margaree Mackintosh, RN Referring MD:              Medicines:                Sedation Administered by an Anesthesia Professional Complications:            No immediate complications. Estimated Blood Loss:     Estimated blood loss was minimal. Procedure:                Pre-Anesthesia Assessment:                           - Prior to the procedure, a History and Physical                            was performed, and patient medications and                            allergies were reviewed. The patient's tolerance of                            previous anesthesia was also reviewed. The risks                            and benefits of the procedure and the sedation                            options and risks were discussed with the patient.                            All questions were answered, and informed consent                            was obtained. Prior Anticoagulants: The patient has                            taken no previous anticoagulant or antiplatelet                            agents. ASA Grade Assessment: II - A patient with                            mild systemic disease. After reviewing the risks                            and benefits, the patient was deemed in  satisfactory condition to undergo the procedure.                           After obtaining informed consent, the endoscope was                            passed under direct vision. Throughout the                            procedure, the patient's blood pressure, pulse, and                            oxygen saturations  were monitored continuously. The                            GIF-[id number] ([id number] Olympus endoscope was introduced                            through the mouth, and advanced to the second part                            of duodenum. The upper GI endoscopy was performed                            with moderate difficulty due to presence of food.                            The patient tolerated the procedure well. Findings:      Food was found in the distal esophagus. Removal of food was accomplished.      Mucosal changes including ringed esophagus, feline appearance and       congestion (edema) were found in the lower third of the esophagus.       Biopsies were obtained from the proximal and distal esophagus with cold       forceps for histology of suspected eosinophilic esophagitis.      Diffuse mild inflammation characterized by congestion (edema) and       erythema was found in the gastric antrum and in the prepyloric region of       the stomach. Biopsies were taken with a cold forceps for histology.      The cardia and gastric fundus were normal on retroflexion.      The duodenal bulb, first portion of the duodenum and second portion of       the duodenum were normal. Impression:               - Food in the distal esophagus. Removal was                            successful.                           - Esophageal mucosal changes suggestive of                            eosinophilic esophagitis. Biopsied.                           -  Gastritis. Biopsied.                           - Normal duodenal bulb, first portion of the                            duodenum and second portion of the duodenum. Recommendation:           - Patient has a contact number available for                            emergencies. The signs and symptoms of potential                            delayed complications were discussed with the                            patient. Return to normal activities tomorrow.                             Written discharge instructions were provided to the                            patient.                           - Soft diet.                           - Continue present medications.                           - Await pathology results.                           - Repeat upper endoscopy in 3 months for dilation.                           - Return to GI clinic in 3 months.                           - Use Protonix (pantoprazole) 40 mg PO BID. Procedure Code(s):        --- Professional ---                           [phone], Esophagogastroduodenoscopy, flexible,                            transoral; with removal of foreign body(s)                           43239, Esophagogastroduodenoscopy, flexible,                            transoral; with biopsy, single or multiple Diagnosis Code(s):        --- Professional ---  U04.540J, Food in esophagus causing other injury,                            initial encounter                           K22.8, Other specified diseases of esophagus                           K29.70, Gastritis, unspecified, without bleeding                           R13.10, Dysphagia, unspecified                           T18.108A, Unspecified foreign body in esophagus                            causing other injury, initial encounter CPT copyright 2019 [employer]. All rights reserved. The codes documented in this report are preliminary and upon coder review may  be revised to meet current compliance requirements. Kathi Der, MD Kathi Der, MD 10/22/2021 5:45:21 PM Number of Addenda: 0

## 2021-10-22 NOTE — Anesthesia Procedure Notes (Signed)
Procedure Name: Intubation Date/Time: 10/22/2021 5:34 PM Performed by: Claudina Lick, CRNA Pre-anesthesia Checklist: Patient identified, Emergency Drugs available, Suction available and Patient being monitored Patient Re-evaluated:Patient Re-evaluated prior to induction Oxygen Delivery Method: Circle system utilized Preoxygenation: Pre-oxygenation with 100% oxygen Induction Type: IV induction, Rapid sequence and Cricoid Pressure applied Laryngoscope Size: Miller and 2 Tube type: Oral Tube size: 7.0 mm Number of attempts: 1 Airway Equipment and Method: Stylet Placement Confirmation: ETT inserted through vocal cords under direct vision, positive ETCO2 and breath sounds checked- equal and bilateral Secured at: 22 cm Tube secured with: Tape Dental Injury: Teeth and Oropharynx as per pre-operative assessment

## 2021-10-22 NOTE — ED Provider Notes (Signed)
Care assumed from Jannifer Hick, PA-C, at shift change, please see their notes for full documentation of patient's complaint/HPI. Briefly, pt here with complaint of food impaction after eating [place] earlier today. Results so far show xray with obvious food bolus in esophagus on lateral view. Awaiting GI consult at this time. Plan is to likely sent for EGD. Pt with hx requiring same. No glucagon provided by earlier provider after discussion with attending physician given hx of EGD in the past from food impaction.   Physical Exam  BP (!) 149/109    Pulse 88    Temp 98.4 F (36.9 C) (Oral)    Resp 14    Ht 5\' 4"  (1.626 m)    Wt 74.8 kg    SpO2 100%    BMI 28.32 kg/m   Physical Exam Vitals and nursing note reviewed.  Constitutional:      Appearance: He is not ill-appearing.  HENT:     Head: Normocephalic and atraumatic.  Eyes:     Conjunctiva/sclera: Conjunctivae normal.  Cardiovascular:     Rate and Rhythm: Normal rate and regular rhythm.  Pulmonary:     Effort: Pulmonary effort is normal.     Breath sounds: Normal breath sounds.  Skin:    General: Skin is warm and dry.     Coloration: Skin is not jaundiced.  Neurological:     Mental Status: He is alert.    Procedures  Procedures  ED Course / MDM    Medical Decision Making Amount and/or Complexity of Data Reviewed Radiology: ordered. Discussion of management or test interpretation with external provider(s): Discussed case with [facility]. who will plan for EGD.          Levora Angel, PA-C 10/22/21 2324    10/24/21, MD 10/23/21 1950

## 2021-10-22 NOTE — Anesthesia Postprocedure Evaluation (Signed)
Anesthesia Post Note  Patient: David Stevens  Procedure(s) Performed: ESOPHAGOGASTRODUODENOSCOPY (EGD) WITH PROPOFOL FOREIGN BODY REMOVAL BIOPSY     Patient location during evaluation: PACU Anesthesia Type: General Level of consciousness: awake and alert Pain management: pain level controlled Vital Signs Assessment: post-procedure vital signs reviewed and stable Respiratory status: spontaneous breathing, nonlabored ventilation, respiratory function stable and patient connected to nasal cannula oxygen Cardiovascular status: blood pressure returned to baseline and stable Postop Assessment: no apparent nausea or vomiting Anesthetic complications: no   No notable events documented.  Last Vitals:  Vitals:   10/22/21 1800 10/22/21 1812  BP: (!) 122/91 120/89  Pulse: 93 87  Resp: 16 11  Temp:  36.5 C  SpO2: 96% 96%    Last Pain:  Vitals:   10/22/21 1812  TempSrc:   PainSc: 0-No pain                 Earl Lites P Aedan Geimer

## 2021-10-24 ENCOUNTER — Encounter (HOSPITAL_COMMUNITY): Payer: Self-pay | Admitting: Gastroenterology

## 2021-10-25 LAB — SURGICAL PATHOLOGY

## 2021-10-26 DIAGNOSIS — J309 Allergic rhinitis, unspecified: Secondary | ICD-10-CM | POA: Diagnosis not present

## 2021-10-26 DIAGNOSIS — Z0000 Encounter for general adult medical examination without abnormal findings: Secondary | ICD-10-CM | POA: Diagnosis not present

## 2021-10-26 DIAGNOSIS — K219 Gastro-esophageal reflux disease without esophagitis: Secondary | ICD-10-CM | POA: Diagnosis not present

## 2021-10-26 DIAGNOSIS — E782 Mixed hyperlipidemia: Secondary | ICD-10-CM | POA: Diagnosis not present

## 2021-10-26 DIAGNOSIS — Z23 Encounter for immunization: Secondary | ICD-10-CM | POA: Diagnosis not present

## 2021-10-26 DIAGNOSIS — J4530 Mild persistent asthma, uncomplicated: Secondary | ICD-10-CM | POA: Diagnosis not present

## 2021-11-30 DIAGNOSIS — R058 Other specified cough: Secondary | ICD-10-CM | POA: Diagnosis not present

## 2021-11-30 DIAGNOSIS — J4531 Mild persistent asthma with (acute) exacerbation: Secondary | ICD-10-CM | POA: Diagnosis not present

## 2021-12-08 ENCOUNTER — Ambulatory Visit
Admission: RE | Admit: 2021-12-08 | Discharge: 2021-12-08 | Disposition: A | Discharge status: Home / Self Care | Payer: BC Managed Care – PPO | Source: Ambulatory Visit | Attending: Family Medicine | Admitting: Family Medicine

## 2021-12-08 ENCOUNTER — Other Ambulatory Visit: Payer: Self-pay | Admitting: Family Medicine

## 2021-12-08 DIAGNOSIS — R051 Acute cough: Secondary | ICD-10-CM

## 2021-12-08 DIAGNOSIS — J309 Allergic rhinitis, unspecified: Secondary | ICD-10-CM | POA: Diagnosis not present

## 2021-12-08 DIAGNOSIS — J4530 Mild persistent asthma, uncomplicated: Secondary | ICD-10-CM

## 2021-12-08 DIAGNOSIS — M419 Scoliosis, unspecified: Secondary | ICD-10-CM | POA: Diagnosis not present

## 2021-12-08 DIAGNOSIS — R059 Cough, unspecified: Secondary | ICD-10-CM | POA: Diagnosis not present

## 2022-01-11 DIAGNOSIS — M79641 Pain in right hand: Secondary | ICD-10-CM | POA: Diagnosis not present

## 2022-01-11 DIAGNOSIS — S62144A Nondisplaced fracture of body of hamate [unciform] bone, right wrist, initial encounter for closed fracture: Secondary | ICD-10-CM | POA: Diagnosis not present

## 2022-01-16 DIAGNOSIS — R1319 Other dysphagia: Secondary | ICD-10-CM | POA: Diagnosis not present

## 2022-01-16 DIAGNOSIS — K200 Eosinophilic esophagitis: Secondary | ICD-10-CM | POA: Diagnosis not present

## 2022-02-02 DIAGNOSIS — S70362A Insect bite (nonvenomous), left thigh, initial encounter: Secondary | ICD-10-CM | POA: Diagnosis not present

## 2022-02-02 DIAGNOSIS — L03116 Cellulitis of left lower limb: Secondary | ICD-10-CM | POA: Diagnosis not present

## 2022-02-15 DIAGNOSIS — F4320 Adjustment disorder, unspecified: Secondary | ICD-10-CM | POA: Diagnosis not present

## 2022-02-16 DIAGNOSIS — F4320 Adjustment disorder, unspecified: Secondary | ICD-10-CM | POA: Diagnosis not present

## 2022-02-22 DIAGNOSIS — M79641 Pain in right hand: Secondary | ICD-10-CM | POA: Diagnosis not present

## 2022-02-24 DIAGNOSIS — F4320 Adjustment disorder, unspecified: Secondary | ICD-10-CM | POA: Diagnosis not present

## 2022-02-27 DIAGNOSIS — F4320 Adjustment disorder, unspecified: Secondary | ICD-10-CM | POA: Diagnosis not present

## 2022-03-02 DIAGNOSIS — K2289 Other specified disease of esophagus: Secondary | ICD-10-CM | POA: Diagnosis not present

## 2022-03-02 DIAGNOSIS — K449 Diaphragmatic hernia without obstruction or gangrene: Secondary | ICD-10-CM | POA: Diagnosis not present

## 2022-03-02 DIAGNOSIS — K2950 Unspecified chronic gastritis without bleeding: Secondary | ICD-10-CM | POA: Diagnosis not present

## 2022-03-02 DIAGNOSIS — K222 Esophageal obstruction: Secondary | ICD-10-CM | POA: Diagnosis not present

## 2022-03-02 DIAGNOSIS — R1310 Dysphagia, unspecified: Secondary | ICD-10-CM | POA: Diagnosis not present

## 2022-03-02 DIAGNOSIS — K200 Eosinophilic esophagitis: Secondary | ICD-10-CM | POA: Diagnosis not present

## 2022-03-09 DIAGNOSIS — F4320 Adjustment disorder, unspecified: Secondary | ICD-10-CM | POA: Diagnosis not present

## 2022-03-16 DIAGNOSIS — F4320 Adjustment disorder, unspecified: Secondary | ICD-10-CM | POA: Diagnosis not present

## 2022-04-08 ENCOUNTER — Ambulatory Visit
Admission: EM | Admit: 2022-04-08 | Discharge: 2022-04-08 | Disposition: A | Discharge status: Home / Self Care | Payer: BC Managed Care – PPO | Attending: Emergency Medicine | Admitting: Emergency Medicine

## 2022-04-08 DIAGNOSIS — Z20822 Contact with and (suspected) exposure to covid-19: Secondary | ICD-10-CM | POA: Insufficient documentation

## 2022-04-08 DIAGNOSIS — R067 Sneezing: Secondary | ICD-10-CM | POA: Diagnosis not present

## 2022-04-08 DIAGNOSIS — J3489 Other specified disorders of nose and nasal sinuses: Secondary | ICD-10-CM | POA: Insufficient documentation

## 2022-04-08 DIAGNOSIS — R058 Other specified cough: Secondary | ICD-10-CM | POA: Insufficient documentation

## 2022-04-08 LAB — SARS CORONAVIRUS 2 BY RT PCR: SARS Coronavirus 2 by RT PCR: POSITIVE — AB

## 2022-04-08 NOTE — ED Triage Notes (Signed)
The patient states he had a covid exposure and now has a runny nose, cough and sneezing for about 2 days.   Home interventions: none

## 2022-04-08 NOTE — Discharge Instructions (Addendum)
You were tested for COVID-19 today.  The result of your COVID-19 test will be posted to your MyChart once it is complete.    Please continue to isolate at home until you receive the results of this. The results will be made available through your MyChart account.  If there are abnormal findings, you will be contacted by phone and if any treatment is needed, that will be provided to you as well.  This test result typically takes 24 to 48 hours to complete .      It is recommend that you should self isolate at home for at least 3 days after your symptoms began AND you are fever free and symptom free without the use of fever reducing medications such as Tylenol and ibuprofen.    Please also keep in mind is also important to self isolate within your household and to wear a mask around other people at home on days 4 through 10 after your symptoms began.  For more information, please read to the [facility] website regarding COVID-19, influenza and RSV isolation guidelines.   If your COVID-19 test is positive, please advise the nurse whether you are interested in taking the recommended antiviral for COVID-19 called Paxlovid.   Thank you for visiting urgent care today.

## 2022-04-08 NOTE — ED Provider Notes (Signed)
[facility]    CSN: [id number] Arrival date & time: 04/08/22  1218    HISTORY   Chief Complaint  Patient presents with   Nasal Congestion   Cough   HPI David Stevens is a pleasant, 41 y.o. male who presents to urgent care today. The patient states he had a covid exposure from his wife at home, states she tested positive yesterday.  Patient states he now has a runny nose, cough and sneezing which has been going on for about 2 days.  He has not tried anything to relieve his symptoms.  The history is provided by the patient.   Past Medical History:  Diagnosis Date   Asthma    Food allergy    Scoliosis    Seasonal allergies    There are no problems to display for this patient.  Past Surgical History:  Procedure Laterality Date   BACK SURGERY     BIOPSY  10/22/2021   Procedure: BIOPSY;  Surgeon: Kathi Der, MD;  Location: [facility];  Service: Gastroenterology;;   ESOPHAGOGASTRODUODENOSCOPY (EGD) WITH PROPOFOL N/A 10/22/2021   Procedure: ESOPHAGOGASTRODUODENOSCOPY (EGD) WITH PROPOFOL;  Surgeon: Kathi Der, MD;  Location: [facility];  Service: Gastroenterology;  Laterality: N/A;   FOREIGN BODY REMOVAL  10/22/2021   Procedure: FOREIGN BODY REMOVAL;  Surgeon: Kathi Der, MD;  Location: [facility];  Service: Gastroenterology;;   TYMPANOSTOMY TUBE PLACEMENT     WISDOM TOOTH EXTRACTION      Home Medications    Prior to Admission medications   Medication Sig Start Date End Date Taking? Authorizing Provider  pantoprazole (PROTONIX) 40 MG tablet Take 1 tablet (40 mg total) by mouth 2 (two) times daily. 10/22/21 10/22/22  Kathi Der, MD    Family History History reviewed. No pertinent family history. Social History Social History   Tobacco Use   Smoking status: Never  Substance Use Topics   Alcohol use: No   Drug use: No   Allergies   Shellfish allergy  Review of Systems Review of Systems Pertinent findings revealed after  performing a 14 point review of systems has been noted in the history of present illness.  Physical Exam Triage Vital Signs ED Triage Vitals  Enc Vitals Group     BP 06/24/21 0827 (!) 147/82     Pulse Rate 06/24/21 0827 72     Resp 06/24/21 0827 18     Temp 06/24/21 0827 98.3 F (36.8 C)     Temp Source 06/24/21 0827 Oral     SpO2 06/24/21 0827 98 %     Weight --      Height --      Head Circumference --      Peak Flow --      Pain Score 06/24/21 0826 5     Pain Loc --      Pain Edu? --      Excl. in GC? --   No data found.  Updated Vital Signs BP 124/84 (BP Location: Right Arm)   Pulse 77   Temp 98.1 F (36.7 C) (Oral)   Resp 18   SpO2 95%   Physical Exam Vitals and nursing note reviewed.  Constitutional:      General: He is not in acute distress.    Appearance: Normal appearance. He is not ill-appearing.  HENT:     Head: Normocephalic and atraumatic.     Salivary Glands: Right salivary gland is not diffusely enlarged or tender. Left salivary gland is not diffusely enlarged  or tender.     Right Ear: Tympanic membrane, ear canal and external ear normal. No drainage. No middle ear effusion. There is no impacted cerumen. Tympanic membrane is not erythematous or bulging.     Left Ear: Tympanic membrane, ear canal and external ear normal. No drainage.  No middle ear effusion. There is no impacted cerumen. Tympanic membrane is not erythematous or bulging.     Nose: Nose normal. No nasal deformity, septal deviation, mucosal edema, congestion or rhinorrhea.     Right Turbinates: Not enlarged, swollen or pale.     Left Turbinates: Not enlarged, swollen or pale.     Right Sinus: No maxillary sinus tenderness or frontal sinus tenderness.     Left Sinus: No maxillary sinus tenderness or frontal sinus tenderness.     Mouth/Throat:     Lips: Pink. No lesions.     Mouth: Mucous membranes are moist. No oral lesions.     Pharynx: Oropharynx is clear. Uvula midline. No posterior  oropharyngeal erythema or uvula swelling.     Tonsils: No tonsillar exudate. 0 on the right. 0 on the left.  Eyes:     General: Lids are normal.        Right eye: No discharge.        Left eye: No discharge.     Extraocular Movements: Extraocular movements intact.     Conjunctiva/sclera: Conjunctivae normal.     Right eye: Right conjunctiva is not injected.     Left eye: Left conjunctiva is not injected.  Neck:     Trachea: Trachea and phonation normal.  Cardiovascular:     Rate and Rhythm: Normal rate and regular rhythm.     Pulses: Normal pulses.     Heart sounds: Normal heart sounds. No murmur heard.    No friction rub. No gallop.  Pulmonary:     Effort: Pulmonary effort is normal. No accessory muscle usage, prolonged expiration or respiratory distress.     Breath sounds: Normal breath sounds. No stridor, decreased air movement or transmitted upper airway sounds. No decreased breath sounds, wheezing, rhonchi or rales.  Chest:     Chest wall: No tenderness.  Musculoskeletal:        General: Normal range of motion.     Cervical back: Normal range of motion and neck supple. Normal range of motion.  Lymphadenopathy:     Cervical: No cervical adenopathy.  Skin:    General: Skin is warm and dry.     Findings: No erythema or rash.  Neurological:     General: No focal deficit present.     Mental Status: He is alert and oriented to person, place, and time.  Psychiatric:        Mood and Affect: Mood normal.        Behavior: Behavior normal.     Visual Acuity Right Eye Distance:   Left Eye Distance:   Bilateral Distance:    Right Eye Near:   Left Eye Near:    Bilateral Near:     UC Couse / Diagnostics / Procedures:     Radiology No results found.  Procedures Procedures (including critical care time) EKG  Pending results:  Labs Reviewed  SARS CORONAVIRUS 2 BY RT PCR - Abnormal; Notable for the following components:      Result Value   SARS Coronavirus 2 by RT PCR  POSITIVE (*)    All other components within normal limits    Medications Ordered in UC: Medications -  No data to display  UC Diagnoses / Final Clinical Impressions(s)   I have reviewed the triage vital signs and the nursing notes.  Pertinent labs & imaging results that were available during my care of the patient were reviewed by me and considered in my medical decision making (see chart for details).    Final diagnoses:  Exposure to COVID-19 virus  Rhinorrhea  Sneezing  Nonproductive cough   Patient offered COVID-19 testing today, patient politely declined stating he has a home COVID test.  Patient was advised to contact us if his test is positive and we will be happy to provide him with prescription for Paxlovid, EMR reviewed, patient has excellent kidney function and there are no drug to drug interactions with any of his medications.  Return precautions advised.  ED Prescriptions   None    PDMP not reviewed this encounter.  Disposition Upon Discharge:  Condition: stable for discharge home Home: take medications as prescribed; routine discharge instructions as discussed; follow up as advised.  Patient presented with an acute illness with associated systemic symptoms and significant discomfort requiring urgent management. In my opinion, this is a condition that a prudent lay person (someone who possesses an average knowledge of health and medicine) may potentially expect to result in complications if not addressed urgently such as respiratory distress, impairment of bodily function or dysfunction of bodily organs.   Routine symptom specific, illness specific and/or disease specific instructions were discussed with the patient and/or caregiver at length.   As such, the patient has been evaluated and assessed, work-up was performed and treatment was provided in alignment with urgent care protocols and evidence based medicine.  Patient/parent/caregiver has been advised that the  patient may require follow up for further testing and treatment if the symptoms continue in spite of treatment, as clinically indicated and appropriate.  If the patient was tested for COVID-19, Influenza and/or RSV, then the patient/parent/guardian was advised to isolate at home pending the results of his/her diagnostic coronavirus test and potentially longer if they're positive. I have also advised pt that if his/her COVID-19 test returns positive, it's recommended to self-isolate for at least 10 days after symptoms first appeared AND until fever-free for 24 hours without fever reducer AND other symptoms have improved or resolved. Discussed self-isolation recommendations as well as instructions for household member/close contacts as per the [facility] and [place] DHHS, and also gave patient the COVID packet with this information.  Patient/parent/caregiver has been advised to return to the [facility] or PCP in 3-5 days if no better; to PCP or the Emergency Department if new signs and symptoms develop, or if the current signs or symptoms continue to change or worsen for further workup, evaluation and treatment as clinically indicated and appropriate  The patient will follow up with their current PCP if and as advised. If the patient does not currently have a PCP we will assist them in obtaining one.   The patient may need specialty follow up if the symptoms continue, in spite of conservative treatment and management, for further workup, evaluation, consultation and treatment as clinically indicated and appropriate.  Patient/parent/caregiver verbalized understanding and agreement of plan as discussed.  All questions were addressed during visit.  Please see discharge instructions below for further details of plan.  Discharge Instructions:   Discharge Instructions      You were tested for COVID-19 today.  The result of your COVID-19 test will be posted to your MyChart once it is complete.  Please continue to isolate  at home until you receive the results of this. The results will be made available through your MyChart account.  If there are abnormal findings, you will be contacted by phone and if any treatment is needed, that will be provided to you as well.  This test result typically takes 24 to 48 hours to complete .      It is recommend that you should self isolate at home for at least 3 days after your symptoms began AND you are fever free and symptom free without the use of fever reducing medications such as Tylenol and ibuprofen.    Please also keep in mind is also important to self isolate within your household and to wear a mask around other people at home on days 4 through 10 after your symptoms began.  For more information, please read to the [facility] website regarding COVID-19, influenza and RSV isolation guidelines.   If your COVID-19 test is positive, please advise the nurse whether you are interested in taking the recommended antiviral for COVID-19 called Paxlovid.   Thank you for visiting urgent care today.       This office note has been dictated using Teaching laboratory technician.  Unfortunately, this method of dictation can sometimes lead to typographical or grammatical errors.  I apologize for your inconvenience in advance if this occurs.  Please do not hesitate to reach out to me if clarification is needed.      Theadora Rama Scales, PA-C 04/10/22 1442

## 2022-04-09 ENCOUNTER — Encounter: Payer: Self-pay | Admitting: Emergency Medicine

## 2022-04-09 ENCOUNTER — Telehealth: Payer: Self-pay | Admitting: Emergency Medicine

## 2022-04-09 DIAGNOSIS — U071 COVID-19: Secondary | ICD-10-CM

## 2022-04-09 HISTORY — DX: COVID-19: U07.1

## 2022-04-09 MED ORDER — PAXLOVID (150/100) 10 X 150 MG & 10 X 100MG PO TBPK: 3.0000 | ORAL_TABLET | Freq: Two times a day (BID) | ORAL | 0 refills | Status: AC

## 2022-04-09 MED ORDER — PAXLOVID (150/100) 10 X 150 MG & 10 X 100MG PO TBPK: 3.0000 | ORAL_TABLET | Freq: Two times a day (BID) | ORAL | 0 refills | Status: DC

## 2022-04-09 NOTE — Telephone Encounter (Signed)
Patient verification complete (name and date of birth).  Patient called requesting to have paxlovid due to symptoms for positive Covid test. Provider sent the medication in and the patient was made aware and educated on how to take.

## 2022-04-09 NOTE — Telephone Encounter (Signed)
Home COVID test is positive per patient.  Patient is requesting prescription for Paxlovid which she has taken in the past without incident.  No drug to drug interactions seen in pt chart per my review nor does patient have hx of kidney issues.  Paxlovid sent to pharmacy.

## 2022-05-04 DIAGNOSIS — K200 Eosinophilic esophagitis: Secondary | ICD-10-CM | POA: Diagnosis not present

## 2022-05-04 DIAGNOSIS — J309 Allergic rhinitis, unspecified: Secondary | ICD-10-CM | POA: Diagnosis not present

## 2022-05-04 DIAGNOSIS — E782 Mixed hyperlipidemia: Secondary | ICD-10-CM | POA: Diagnosis not present

## 2022-05-04 DIAGNOSIS — J4530 Mild persistent asthma, uncomplicated: Secondary | ICD-10-CM | POA: Diagnosis not present

## 2022-05-12 DIAGNOSIS — F4320 Adjustment disorder, unspecified: Secondary | ICD-10-CM | POA: Diagnosis not present

## 2022-06-02 DIAGNOSIS — F4320 Adjustment disorder, unspecified: Secondary | ICD-10-CM | POA: Diagnosis not present

## 2022-06-16 DIAGNOSIS — F4320 Adjustment disorder, unspecified: Secondary | ICD-10-CM | POA: Diagnosis not present

## 2022-07-03 DIAGNOSIS — F4320 Adjustment disorder, unspecified: Secondary | ICD-10-CM | POA: Diagnosis not present

## 2022-07-17 DIAGNOSIS — F4320 Adjustment disorder, unspecified: Secondary | ICD-10-CM | POA: Diagnosis not present

## 2022-07-28 DIAGNOSIS — F4320 Adjustment disorder, unspecified: Secondary | ICD-10-CM | POA: Diagnosis not present

## 2022-08-11 DIAGNOSIS — F4320 Adjustment disorder, unspecified: Secondary | ICD-10-CM | POA: Diagnosis not present

## 2022-09-01 DIAGNOSIS — F4320 Adjustment disorder, unspecified: Secondary | ICD-10-CM | POA: Diagnosis not present

## 2022-09-07 DIAGNOSIS — J208 Acute bronchitis due to other specified organisms: Secondary | ICD-10-CM | POA: Diagnosis not present

## 2022-09-08 DIAGNOSIS — F4320 Adjustment disorder, unspecified: Secondary | ICD-10-CM | POA: Diagnosis not present

## 2022-10-05 DIAGNOSIS — R1319 Other dysphagia: Secondary | ICD-10-CM | POA: Diagnosis not present

## 2022-10-05 DIAGNOSIS — K200 Eosinophilic esophagitis: Secondary | ICD-10-CM | POA: Diagnosis not present

## 2022-10-06 DIAGNOSIS — F4320 Adjustment disorder, unspecified: Secondary | ICD-10-CM | POA: Diagnosis not present

## 2022-10-20 DIAGNOSIS — F4320 Adjustment disorder, unspecified: Secondary | ICD-10-CM | POA: Diagnosis not present

## 2022-11-22 IMAGING — DX DG CHEST 2V
2 series · 2 of 2 positions shown · non-contrast
Comparison: None.

CLINICAL DATA: 41-year-old male with a history of acute cough

EXAM:
CHEST - 2 VIEW

[dg chest 2 view (1 of 2)]
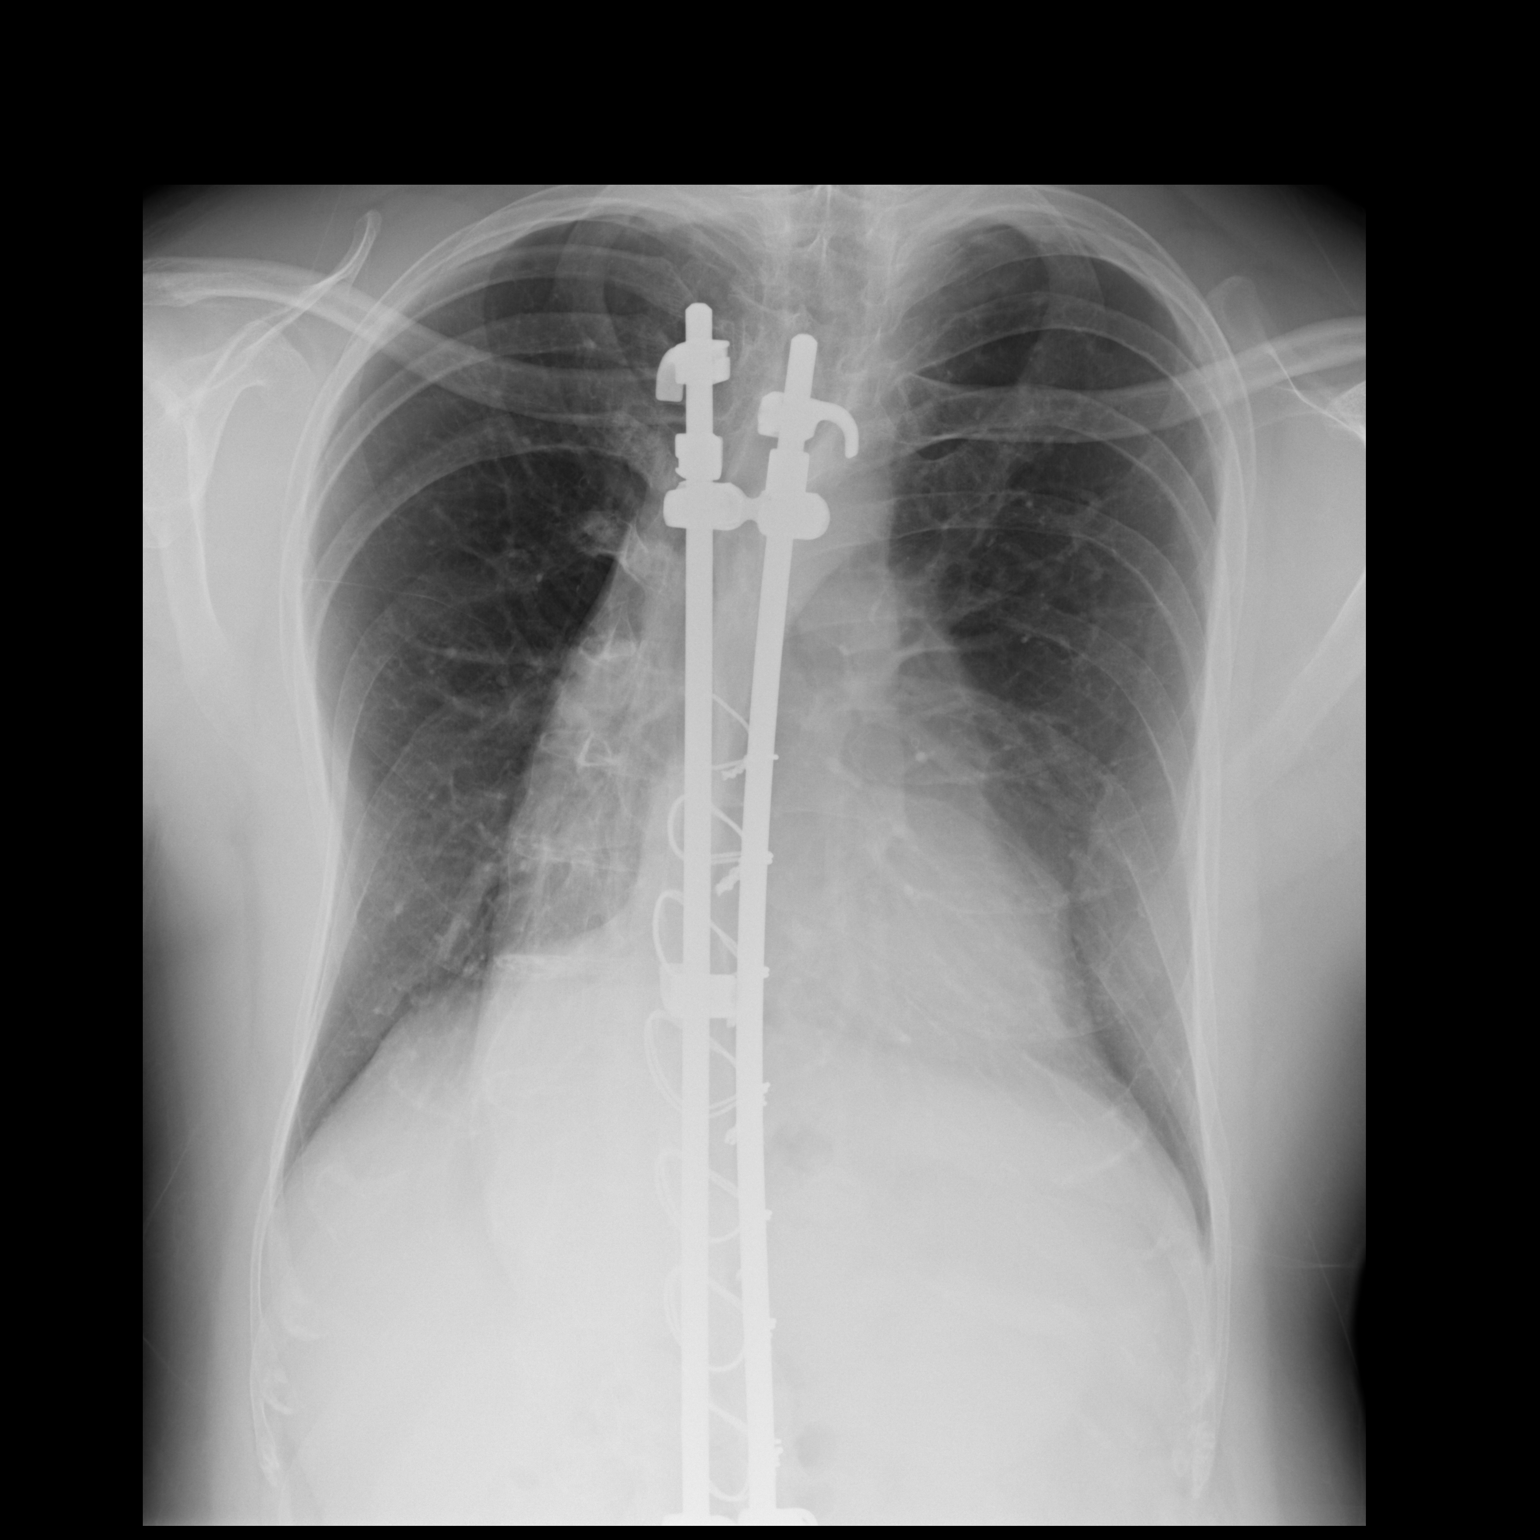

[dg chest 2 view (2 of 2)]
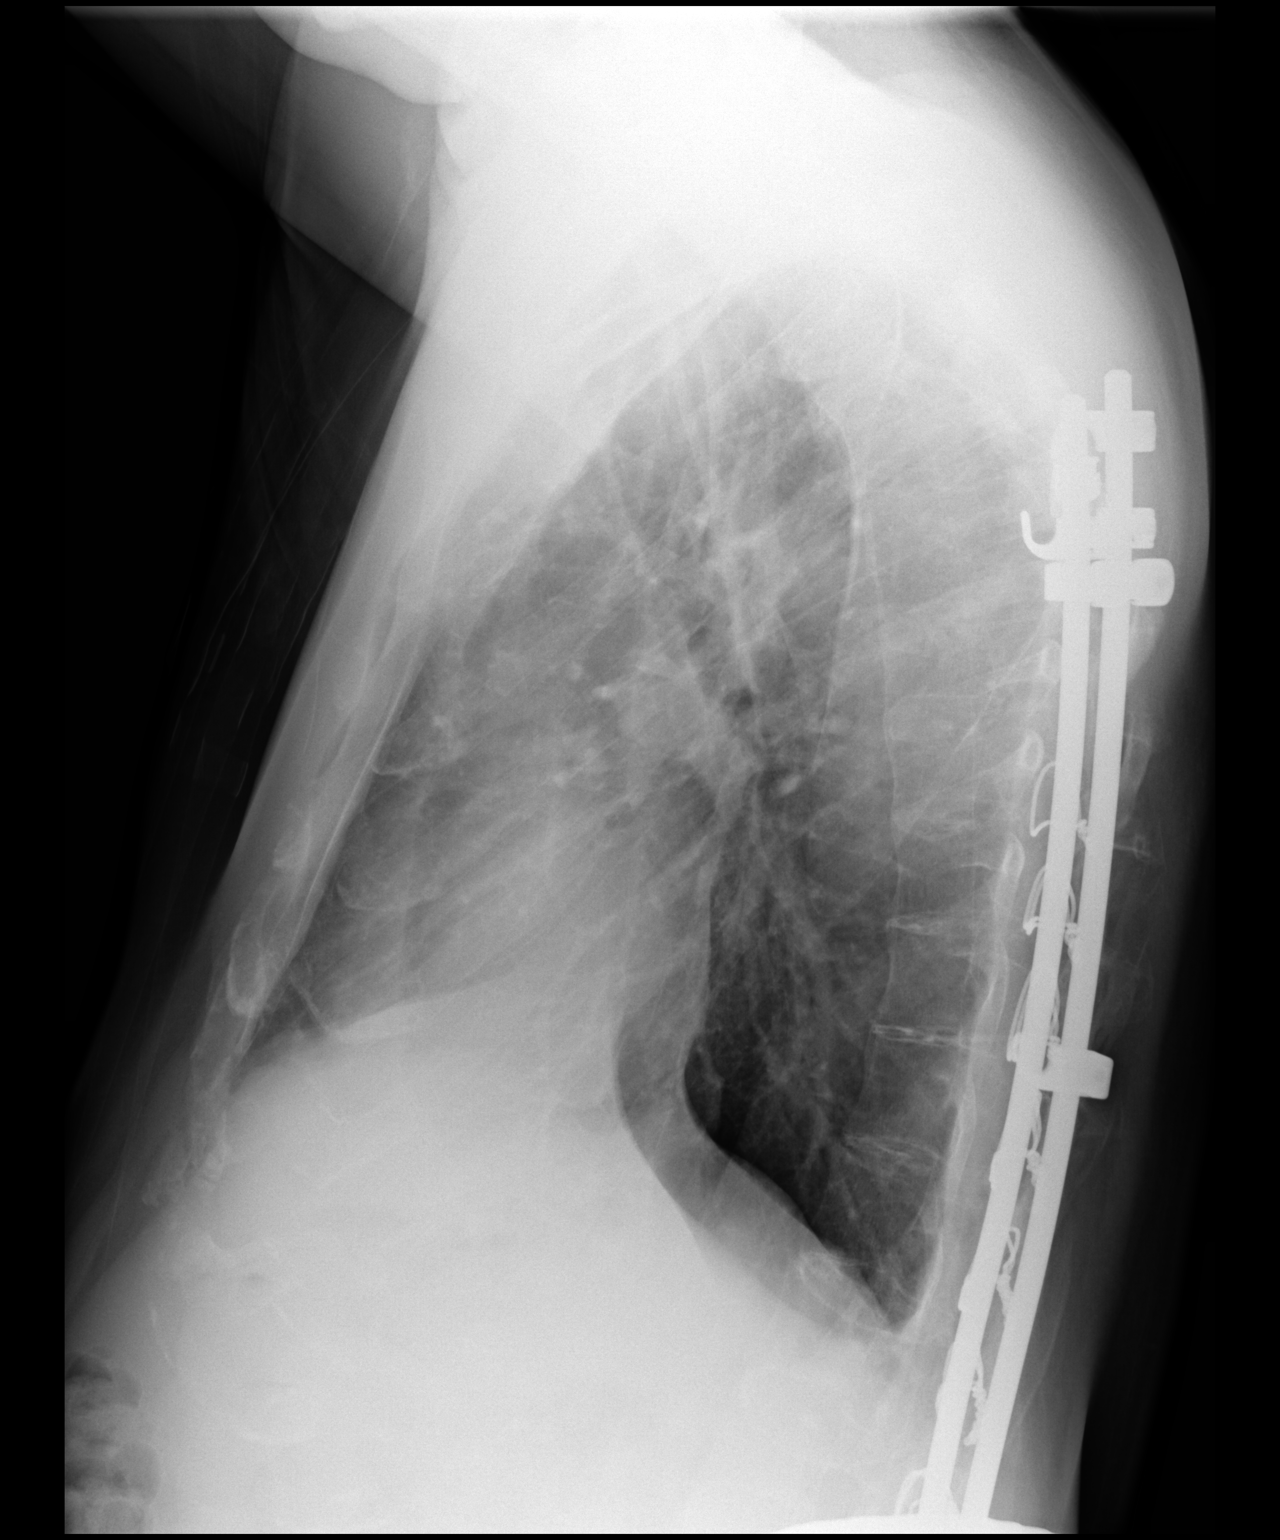

[2 of 2 positions shown; findings below may reference images not displayed]

FINDINGS: Cardiomediastinal silhouette likely within normal limits with the
right heart border obscured by the spine.

No pneumothorax. No pleural effusion. No confluent airspace disease.

Surgical changes of thoracolumbar fixation with scoliotic curvature,
apex right.

No displaced fracture
IMPRESSION: Negative for acute cardiopulmonary disease

## 2022-11-28 DIAGNOSIS — E782 Mixed hyperlipidemia: Secondary | ICD-10-CM | POA: Diagnosis not present

## 2022-11-28 DIAGNOSIS — J4530 Mild persistent asthma, uncomplicated: Secondary | ICD-10-CM | POA: Diagnosis not present

## 2022-11-28 DIAGNOSIS — K219 Gastro-esophageal reflux disease without esophagitis: Secondary | ICD-10-CM | POA: Diagnosis not present

## 2022-11-28 DIAGNOSIS — Z0000 Encounter for general adult medical examination without abnormal findings: Secondary | ICD-10-CM | POA: Diagnosis not present

## 2022-12-08 DIAGNOSIS — F4320 Adjustment disorder, unspecified: Secondary | ICD-10-CM | POA: Diagnosis not present

## 2022-12-11 DIAGNOSIS — F4329 Adjustment disorder with other symptoms: Secondary | ICD-10-CM | POA: Diagnosis not present

## 2023-01-19 DIAGNOSIS — F4320 Adjustment disorder, unspecified: Secondary | ICD-10-CM | POA: Diagnosis not present

## 2023-02-01 DIAGNOSIS — E782 Mixed hyperlipidemia: Secondary | ICD-10-CM | POA: Diagnosis not present

## 2023-02-02 DIAGNOSIS — M79602 Pain in left arm: Secondary | ICD-10-CM | POA: Diagnosis not present

## 2023-02-07 DIAGNOSIS — J0100 Acute maxillary sinusitis, unspecified: Secondary | ICD-10-CM | POA: Diagnosis not present

## 2023-02-07 DIAGNOSIS — B309 Viral conjunctivitis, unspecified: Secondary | ICD-10-CM | POA: Diagnosis not present

## 2023-02-07 DIAGNOSIS — J309 Allergic rhinitis, unspecified: Secondary | ICD-10-CM | POA: Diagnosis not present

## 2023-02-23 DIAGNOSIS — F4320 Adjustment disorder, unspecified: Secondary | ICD-10-CM | POA: Diagnosis not present

## 2023-05-21 DIAGNOSIS — F840 Autistic disorder: Secondary | ICD-10-CM | POA: Diagnosis not present

## 2023-06-13 DIAGNOSIS — F900 Attention-deficit hyperactivity disorder, predominantly inattentive type: Secondary | ICD-10-CM | POA: Diagnosis not present

## 2023-07-12 DIAGNOSIS — E782 Mixed hyperlipidemia: Secondary | ICD-10-CM | POA: Diagnosis not present

## 2023-07-12 DIAGNOSIS — J4530 Mild persistent asthma, uncomplicated: Secondary | ICD-10-CM | POA: Diagnosis not present

## 2023-07-12 DIAGNOSIS — M419 Scoliosis, unspecified: Secondary | ICD-10-CM | POA: Diagnosis not present

## 2023-07-12 DIAGNOSIS — J309 Allergic rhinitis, unspecified: Secondary | ICD-10-CM | POA: Diagnosis not present

## 2023-07-19 DIAGNOSIS — F900 Attention-deficit hyperactivity disorder, predominantly inattentive type: Secondary | ICD-10-CM | POA: Diagnosis not present

## 2023-07-19 DIAGNOSIS — F411 Generalized anxiety disorder: Secondary | ICD-10-CM | POA: Diagnosis not present

## 2023-08-07 DIAGNOSIS — F900 Attention-deficit hyperactivity disorder, predominantly inattentive type: Secondary | ICD-10-CM | POA: Diagnosis not present

## 2023-08-07 DIAGNOSIS — F411 Generalized anxiety disorder: Secondary | ICD-10-CM | POA: Diagnosis not present

## 2023-08-12 ENCOUNTER — Encounter (HOSPITAL_COMMUNITY)
Admission: EM | Disposition: A | Discharge status: Home / Self Care | Payer: Self-pay | Source: Home / Self Care | Attending: Student

## 2023-08-12 ENCOUNTER — Other Ambulatory Visit: Payer: Self-pay

## 2023-08-12 ENCOUNTER — Ambulatory Visit (HOSPITAL_COMMUNITY)
Admission: EM | Admit: 2023-08-12 | Discharge: 2023-08-12 | Disposition: A | Discharge status: Home / Self Care | Payer: BC Managed Care – PPO | Attending: Gastroenterology | Admitting: Gastroenterology

## 2023-08-12 ENCOUNTER — Emergency Department (HOSPITAL_COMMUNITY): Payer: BC Managed Care – PPO | Admitting: Certified Registered Nurse Anesthetist

## 2023-08-12 ENCOUNTER — Encounter (HOSPITAL_COMMUNITY): Payer: Self-pay

## 2023-08-12 ENCOUNTER — Emergency Department (HOSPITAL_COMMUNITY): Payer: BC Managed Care – PPO

## 2023-08-12 DIAGNOSIS — Z7951 Long term (current) use of inhaled steroids: Secondary | ICD-10-CM | POA: Diagnosis not present

## 2023-08-12 DIAGNOSIS — Z79899 Other long term (current) drug therapy: Secondary | ICD-10-CM | POA: Diagnosis not present

## 2023-08-12 DIAGNOSIS — T182XXA Foreign body in stomach, initial encounter: Secondary | ICD-10-CM | POA: Diagnosis not present

## 2023-08-12 DIAGNOSIS — K2289 Other specified disease of esophagus: Secondary | ICD-10-CM | POA: Diagnosis not present

## 2023-08-12 DIAGNOSIS — W44F3XA Food entering into or through a natural orifice, initial encounter: Secondary | ICD-10-CM | POA: Diagnosis not present

## 2023-08-12 DIAGNOSIS — T18128A Food in esophagus causing other injury, initial encounter: Secondary | ICD-10-CM | POA: Insufficient documentation

## 2023-08-12 DIAGNOSIS — J45909 Unspecified asthma, uncomplicated: Secondary | ICD-10-CM | POA: Insufficient documentation

## 2023-08-12 DIAGNOSIS — T18108A Unspecified foreign body in esophagus causing other injury, initial encounter: Secondary | ICD-10-CM | POA: Diagnosis not present

## 2023-08-12 DIAGNOSIS — K200 Eosinophilic esophagitis: Secondary | ICD-10-CM | POA: Insufficient documentation

## 2023-08-12 DIAGNOSIS — K219 Gastro-esophageal reflux disease without esophagitis: Secondary | ICD-10-CM | POA: Insufficient documentation

## 2023-08-12 DIAGNOSIS — Z0389 Encounter for observation for other suspected diseases and conditions ruled out: Secondary | ICD-10-CM | POA: Diagnosis not present

## 2023-08-12 HISTORY — PX: ESOPHAGOGASTRODUODENOSCOPY (EGD) WITH PROPOFOL: SHX5813

## 2023-08-12 HISTORY — PX: BALLOON DILATION: SHX5330

## 2023-08-12 HISTORY — PX: FOREIGN BODY REMOVAL: SHX962

## 2023-08-12 SURGERY — REMOVAL, FOREIGN BODY
Anesthesia: General

## 2023-08-12 MED ORDER — SODIUM CHLORIDE 0.9 % IV SOLN: INTRAVENOUS | Status: DC | PRN

## 2023-08-12 MED ORDER — ONDANSETRON HCL 4 MG/2ML IJ SOLN
INTRAMUSCULAR | Status: DC | PRN
  Administered 2023-08-12: 4 mg via INTRAVENOUS

## 2023-08-12 MED ORDER — SUCCINYLCHOLINE CHLORIDE 200 MG/10ML IV SOSY
PREFILLED_SYRINGE | INTRAVENOUS | Status: DC | PRN
  Administered 2023-08-12: 100 mg via INTRAVENOUS

## 2023-08-12 MED ORDER — LIDOCAINE 2% (20 MG/ML) 5 ML SYRINGE
INTRAMUSCULAR | Status: DC | PRN
  Administered 2023-08-12: 60 mg via INTRAVENOUS

## 2023-08-12 MED ORDER — EPHEDRINE SULFATE-NACL 50-0.9 MG/10ML-% IV SOSY: PREFILLED_SYRINGE | INTRAVENOUS | Status: DC | PRN

## 2023-08-12 MED ORDER — DEXAMETHASONE SODIUM PHOSPHATE 10 MG/ML IJ SOLN
INTRAMUSCULAR | Status: DC | PRN
  Administered 2023-08-12: 10 mg via INTRAVENOUS

## 2023-08-12 MED ORDER — FENTANYL CITRATE (PF) 100 MCG/2ML IJ SOLN
INTRAMUSCULAR | Status: AC
  Filled 2023-08-12: qty 2

## 2023-08-12 MED ORDER — MIDAZOLAM HCL 2 MG/2ML IJ SOLN
INTRAMUSCULAR | Status: AC
  Filled 2023-08-12: qty 2

## 2023-08-12 MED ORDER — PROPOFOL 10 MG/ML IV BOLUS
INTRAVENOUS | Status: DC | PRN
  Administered 2023-08-12: 160 mg via INTRAVENOUS

## 2023-08-12 MED ORDER — GLUCAGON HCL RDNA (DIAGNOSTIC) 1 MG IJ SOLR
1.0000 mg | Freq: Once | INTRAMUSCULAR | Status: AC
  Administered 2023-08-12: 1 mg via INTRAVENOUS
  Filled 2023-08-12: qty 1

## 2023-08-12 MED ORDER — FENTANYL CITRATE (PF) 250 MCG/5ML IJ SOLN
INTRAMUSCULAR | Status: DC | PRN
  Administered 2023-08-12: 50 ug via INTRAVENOUS

## 2023-08-12 MED ORDER — MIDAZOLAM HCL 2 MG/2ML IJ SOLN
INTRAMUSCULAR | Status: DC | PRN
  Administered 2023-08-12: 1 mg via INTRAVENOUS

## 2023-08-12 NOTE — Anesthesia Postprocedure Evaluation (Signed)
Anesthesia Post Note  Patient: David Stevens  Procedure(s) Performed: FOREIGN BODY REMOVAL ESOPHAGOGASTRODUODENOSCOPY (EGD) WITH PROPOFOL BALLOON DILATION     Patient location during evaluation: PACU Anesthesia Type: General Level of consciousness: awake and alert Pain management: pain level controlled Vital Signs Assessment: post-procedure vital signs reviewed and stable Respiratory status: spontaneous breathing, nonlabored ventilation, respiratory function stable and patient connected to nasal cannula oxygen Cardiovascular status: blood pressure returned to baseline and stable Postop Assessment: no apparent nausea or vomiting Anesthetic complications: no  No notable events documented.  Last Vitals:  Vitals:   08/12/23 1800 08/12/23 1810  BP: (!) 132/101 (!) 135/92  Pulse: 87 85  Resp: 10 16  Temp:    SpO2: 98% 98%    Last Pain:  Vitals:   08/12/23 1757  TempSrc:   PainSc: 0-No pain                 Kennieth Rad

## 2023-08-12 NOTE — ED Provider Notes (Signed)
[place] EMERGENCY DEPARTMENT AT [facility] Provider Note  CSN: [id number] Arrival date & time: 08/12/23 1522  Chief Complaint(s) food stuck in throat  HPI David Stevens is a 42 y.o. male with PMH asthma, GERD, previous food bolus impaction in 2023 who presents emergency department for evaluation of a food bolus impaction.  Patient states that he was eating beef stew around noon today when he felt the food got stuck in his esophagus.  He states that over the last few months he has had some episodes where he is able to dislodge the food by himself but today he has been unable to do so.  Is able to swallow small amounts of saliva but is unable to tolerate any liquids or solids without vomiting.  Denies abdominal pain, headache, fever or other systemic symptoms.   Past Medical History Past Medical History:  Diagnosis Date   Asthma    COVID-19 04/09/2022   Food allergy    Scoliosis    Seasonal allergies    There are no active problems to display for this patient.  Home Medication(s) Prior to Admission medications   Medication Sig Start Date End Date Taking? Authorizing Provider  ADVAIR DISKUS 100-50 MCG/ACT AEPB Inhale 1 puff into the lungs 2 (two) times daily. 01/02/22   [provider]  esomeprazole (NEXIUM) 40 MG capsule Take 40 mg by mouth 2 (two) times daily. 03/09/22   [provider]  montelukast (SINGULAIR) 10 MG tablet SMARTSIG:1 Tablet(s) By Mouth Every Evening 01/11/22   [provider]  pantoprazole (PROTONIX) 40 MG tablet Take 1 tablet (40 mg total) by mouth 2 (two) times daily. 10/22/21 10/22/22  Kathi Der, MD                                                                                                                                    Past Surgical History Past Surgical History:  Procedure Laterality Date   BACK SURGERY     BIOPSY  10/22/2021   Procedure: BIOPSY;  Surgeon: Kathi Der, MD;  Location: [facility] ENDOS[facility];   Service: Gastroenterology;;   ESOPHAGOGASTRODUODENOSCOPY (EGD) WITH PROPOFOL N/A 10/22/2021   Procedure: ESOPHAGOGASTRODUODENOSCOPY (EGD) WITH PROPOFOL;  Surgeon: Kathi Der, MD;  Location: [facility] ENDOSCOPY;  Service: Gastroenterology;  Laterality: N/A;   FOREIGN BODY REMOVAL  10/22/2021   Procedure: FOREIGN BODY REMOVAL;  Surgeon: Kathi Der, MD;  Location: [facility];  Service: Gastroenterology;;   TYMPANOSTOMY TUBE PLACEMENT     WISDOM TOOTH EXTRACTION     Family History History reviewed. No pertinent family history.  Social History Social History   Tobacco Use   Smoking status: Never  Substance Use Topics   Alcohol use: No   Drug use: No   Allergies Shellfish allergy  Review of Systems Review of Systems  Gastrointestinal:  Positive for nausea and vomiting.    Physical Exam Vital Signs  I have reviewed the triage vital signs  BP (!) 123/94 (BP Location: Right Arm)   Pulse 98   Temp 98.3 F (36.8 C)   Resp 18   Ht 5\' 4"  (1.626 m)   Wt 74.8 kg   SpO2 99%   BMI 28.31 kg/m   Physical Exam Constitutional:      General: He is not in acute distress.    Appearance: Normal appearance.  HENT:     Head: Normocephalic and atraumatic.     Nose: No congestion or rhinorrhea.  Eyes:     General:        Right eye: No discharge.        Left eye: No discharge.     Extraocular Movements: Extraocular movements intact.     Pupils: Pupils are equal, round, and reactive to light.  Cardiovascular:     Rate and Rhythm: Normal rate and regular rhythm.     Heart sounds: No murmur heard. Pulmonary:     Effort: No respiratory distress.     Breath sounds: No wheezing or rales.  Abdominal:     General: There is no distension.     Tenderness: There is no abdominal tenderness.  Musculoskeletal:        General: Normal range of motion.     Cervical back: Normal range of motion.  Skin:    General: Skin is warm and dry.  Neurological:     General: No focal deficit present.      Mental Status: He is alert.     ED Results and Treatments Labs (all labs ordered are listed, but only abnormal results are displayed) Labs Reviewed - No data to display                                                                                                                        Radiology No results found.  Pertinent labs & imaging results that were available during my care of the patient were reviewed by me and considered in my medical decision making (see MDM for details).  Medications Ordered in ED Medications - No data to display                                                                                                                                   Procedures Procedures  (including critical care time)  Medical Decision Making / ED Course   This patient presents to the ED for concern  of food stuck in throat, this involves an extensive number of treatment options, and is a complaint that carries with it a high risk of complications and morbidity.  The differential diagnosis includes esophageal food bolus impaction, esophageal rupture, pneumomediastinum, Boerhaave's, mass  MDM: Seen emergency room for evaluation of a sensation of food being stuck in the throat.  Physical exam largely unremarkable but patient is spitting frequently and having difficulty tolerating secretions.  Chest x-ray without evidence of pneumomediastinum.  Spoke with [facility] on-call Dr. Pati Gallo who will set the patient up for an EGD today but is requesting a glucagon trial.  Patient then taken to the endoscopy suite for food bolus removal.   Additional history obtained: -Additional history obtained from wife -External records from outside source obtained and reviewed including: Chart review including previous notes, labs, imaging, consultation notes   Imaging Studies ordered: I ordered imaging studies including chest x-ray I independently visualized and interpreted imaging. I agree  with the radiologist interpretation   Medicines ordered and prescription drug management: No orders of the defined types were placed in this encounter.   -I have reviewed the patients home medicines and have made adjustments as needed  Critical interventions none  Consultations Obtained: I requested consultation with the gastroenterologist on-call Dr. Marca Ancona,  and discussed lab and imaging findings as well as pertinent plan - they recommend: EGD and glucagon   Cardiac Monitoring: The patient was maintained on a cardiac monitor.  I personally viewed and interpreted the cardiac monitored which showed an underlying rhythm of: NSR  Social Determinants of Health:  Factors impacting patients care include: none   Reevaluation: After the interventions noted above, I reevaluated the patient and found that they have :stayed the same  Co morbidities that complicate the patient evaluation  Past Medical History:  Diagnosis Date   Asthma    COVID-19 04/09/2022   Food allergy    Scoliosis    Seasonal allergies       Dispostion: I considered admission for this patient, and patient will be taken to the endoscopy suite for food bolus removal.     Final Clinical Impression(s) / ED Diagnoses Final diagnoses:  None     @PCDICTATION @    Glendora Score, MD 08/12/23 1651

## 2023-08-12 NOTE — Anesthesia Preprocedure Evaluation (Signed)
Anesthesia Evaluation  Patient identified by MRN, date of birth, ID band Patient awake    Reviewed: Allergy & Precautions, NPO status , Patient's Chart, lab work & pertinent test results  Airway Mallampati: II  TM Distance: >3 FB Neck ROM: Full    Dental  (+) Dental Advisory Given   Pulmonary asthma    breath sounds clear to auscultation       Cardiovascular negative cardio ROS  Rhythm:Regular Rate:Normal     Neuro/Psych negative neurological ROS     GI/Hepatic Neg liver ROS,,,Food impaction   Endo/Other  negative endocrine ROS    Renal/GU negative Renal ROS     Musculoskeletal   Abdominal   Peds  Hematology negative hematology ROS (+)   Anesthesia Other Findings   Reproductive/Obstetrics                             Anesthesia Physical Anesthesia Plan  ASA: 2 and emergent  Anesthesia Plan: General   Post-op Pain Management:    Induction: Intravenous and Rapid sequence  PONV Risk Score and Plan: 2 and Dexamethasone and Ondansetron  Airway Management Planned: Oral ETT  Additional Equipment:   Intra-op Plan:   Post-operative Plan: Extubation in OR  Informed Consent: I have reviewed the patients History and Physical, chart, labs and discussed the procedure including the risks, benefits and alternatives for the proposed anesthesia with the patient or authorized representative who has indicated his/her understanding and acceptance.     Dental advisory given  Plan Discussed with: CRNA  Anesthesia Plan Comments:        Anesthesia Quick Evaluation

## 2023-08-12 NOTE — Transfer of Care (Signed)
Immediate Anesthesia Transfer of Care Note  Patient: Mady Haagensen  Procedure(s) Performed: FOREIGN BODY REMOVAL  Patient Location: PACU  Anesthesia Type:General  Level of Consciousness: awake, alert , and oriented  Airway & Oxygen Therapy: Patient Spontanous Breathing  Post-op Assessment: Report given to RN and Post -op Vital signs reviewed and stable  Post vital signs: Reviewed and stable  Last Vitals:  Vitals Value Taken Time  BP 135/99 08/12/23 1747  Temp 36.7 C 08/12/23 1747  Pulse 89 08/12/23 1749  Resp 13 08/12/23 1749  SpO2 96 % 08/12/23 1749  Vitals shown include unfiled device data.  Last Pain:  Vitals:   08/12/23 1747  TempSrc: Temporal  PainSc: 0-No pain         Complications: No notable events documented.

## 2023-08-12 NOTE — H&P (Signed)
David Stevens is an 42 y.o. male.   Chief Complaint: Esophageal food bolus impaction HPI: 42 year old male with history of eosinophilic esophagitis, on Nexium had beef stew today and felt the beef piece and carrot stuck in his upper throat.  He vomited twice and got some food out, however has not been able to drink or eat thereafter.  Past Medical History:  Diagnosis Date   Asthma    COVID-19 04/09/2022   Food allergy    Scoliosis    Seasonal allergies     Past Surgical History:  Procedure Laterality Date   BACK SURGERY     BIOPSY  10/22/2021   Procedure: BIOPSY;  Surgeon: Kathi Der, MD;  Location: [facility];  Service: Gastroenterology;;   ESOPHAGOGASTRODUODENOSCOPY (EGD) WITH PROPOFOL N/A 10/22/2021   Procedure: ESOPHAGOGASTRODUODENOSCOPY (EGD) WITH PROPOFOL;  Surgeon: Kathi Der, MD;  Location: [facility];  Service: Gastroenterology;  Laterality: N/A;   FOREIGN BODY REMOVAL  10/22/2021   Procedure: FOREIGN BODY REMOVAL;  Surgeon: Kathi Der, MD;  Location: [facility];  Service: Gastroenterology;;   TYMPANOSTOMY TUBE PLACEMENT     WISDOM TOOTH EXTRACTION      History reviewed. No pertinent family history. Social History:  reports that he has never smoked. He does not have any smokeless tobacco history on file. He reports that he does not drink alcohol and does not use drugs.  Allergies:  Allergies  Allergen Reactions   Shellfish Allergy Anaphylaxis    Medications Prior to Admission  Medication Sig Dispense Refill   ADVAIR DISKUS 100-50 MCG/ACT AEPB Inhale 1 puff into the lungs 2 (two) times daily.     esomeprazole (NEXIUM) 40 MG capsule Take 40 mg by mouth 2 (two) times daily.     montelukast (SINGULAIR) 10 MG tablet SMARTSIG:1 Tablet(s) By Mouth Every Evening     pantoprazole (PROTONIX) 40 MG tablet Take 1 tablet (40 mg total) by mouth 2 (two) times daily. 60 tablet 2    No results found for this or any previous visit (from the past 48  hours). No results found.  Review of Systems  Constitutional:  Positive for activity change.  Gastrointestinal:  Negative for abdominal distention and abdominal pain.  Allergic/Immunologic: Positive for environmental allergies and food allergies.    Blood pressure (!) 128/98, pulse 91, temperature 98 F (36.7 C), temperature source Temporal, resp. rate 11, height 5\' 4"  (1.626 m), weight 74.8 kg, SpO2 99%. Physical Exam Constitutional:      Appearance: Normal appearance.  HENT:     Head: Normocephalic and atraumatic.  Eyes:     Conjunctiva/sclera: Conjunctivae normal.  Cardiovascular:     Rate and Rhythm: Normal rate and regular rhythm.     Pulses: Normal pulses.     Heart sounds: Normal heart sounds.  Pulmonary:     Effort: Pulmonary effort is normal.     Breath sounds: Normal breath sounds.  Abdominal:     General: Bowel sounds are normal.     Palpations: Abdomen is soft.  Skin:    General: Skin is warm and dry.  Neurological:     General: No focal deficit present.     Mental Status: He is alert and oriented to person, place, and time.      Assessment/Plan Esophageal food bolus impaction History of eosinophilic esophagitis  Plan emergent endoscopy for food bolus removal, possible biopsies and balloon dilation depending upon findings.  Kerin Salen, MD 08/12/2023, 5:09 PM

## 2023-08-12 NOTE — Discharge Instructions (Signed)

## 2023-08-12 NOTE — Anesthesia Procedure Notes (Signed)
Procedure Name: Intubation Date/Time: 08/12/2023 5:15 PM  Performed by: Dairl Ponder, CRNAPre-anesthesia Checklist: Patient identified, Emergency Drugs available, Suction available and Patient being monitored Patient Re-evaluated:Patient Re-evaluated prior to induction Oxygen Delivery Method: Circle System Utilized Preoxygenation: Pre-oxygenation with 100% oxygen Induction Type: IV induction, Rapid sequence and Cricoid Pressure applied Laryngoscope Size: Mac and 4 Grade View: Grade II Tube type: Oral Tube size: 7.5 mm Number of attempts: 1 Airway Equipment and Method: Stylet and Oral airway Placement Confirmation: ETT inserted through vocal cords under direct vision, positive ETCO2 and breath sounds checked- equal and bilateral Secured at: 23 cm Tube secured with: Tape Dental Injury: Teeth and Oropharynx as per pre-operative assessment

## 2023-08-12 NOTE — ED Triage Notes (Signed)
Pt states he has had food stuck in throat in past and usually uses Dr Reino Kent, crackers or water to dislodge the food, but it has not helped this time. Pt states he was eating beef stew when it got lodged in his throat at 1230 today. Pt has bucket with him, because he will vomit saliva and things at times. Pt is maintaining airway.

## 2023-08-13 NOTE — Op Note (Signed)
[facility] Patient Name: David Stevens Procedure Date : 08/12/2023 MRN: [id number] Attending MD: Kerin Salen , MD, [id number] Date of Birth: 04/27/1981 CSN: [id number] Age: 42 Admit Type: Inpatient Procedure:                Upper GI endoscopy Indications:              Foreign body/food bolus in the esophagus, History                            of eosinophilic esophagitis Providers:                Kerin Salen, MD, Eliberto Ivory, RN, Rozetta Nunnery,                            Technician Referring MD:             ER Medicines:                Monitored Anesthesia Care Complications:            No immediate complications. Estimated blood loss:                            Minimal. Estimated Blood Loss:     Estimated blood loss was minimal. Procedure:                Pre-Anesthesia Assessment:                           - Prior to the procedure, a History and Physical                            was performed, and patient medications and                            allergies were reviewed. The patient's tolerance of                            previous anesthesia was also reviewed. The risks                            and benefits of the procedure and the sedation                            options and risks were discussed with the patient.                            All questions were answered, and informed consent                            was obtained. Prior Anticoagulants: The patient has                            taken no anticoagulant or antiplatelet agents. ASA                            Grade  Assessment: III - A patient with severe                            systemic disease. After reviewing the risks and                            benefits, the patient was deemed in satisfactory                            condition to undergo the procedure.                           After obtaining informed consent, the endoscope was                            passed under direct  vision. Throughout the                            procedure, the patient's blood pressure, pulse, and                            oxygen saturations were monitored continuously. The                            [id number]-[id number] ([id number]) Olympus endoscope was introduced                            through the mouth, and advanced to the second part                            of duodenum. The upper GI endoscopy was                            accomplished without difficulty. The patient                            tolerated the procedure well. Scope In: Scope Out: Findings:      Food (a large, 2 inch size, piece of meat) was found in the middle third       of the esophagus. Removal was accomplished with a Roth net.      Mucosal changes including ringed esophagus, feline appearance,       longitudinal furrows and small-caliber esophagus were found in the       middle third of the esophagus and in the lower third of the esophagus.       Esophageal findings were graded using the Eosinophilic Esophagitis       Endoscopic Reference Score (EoE-EREFS) as:      Edema Grade 1 Present (decreased clarity or absence of vascular       markings),      Rings Grade 2 Moderate (distinct rings that do not occlude passage of       diagnostic 8-10 mm endoscope),      Exudates Grade 0 None (no white lesions seen),      Furrows Grade 1 Mild (vertical lines without visible depth) and  Stricture present.      Biopsies were obtained from the proximal and distal esophagus with cold       forceps for histology of suspected eosinophilic esophagitis and sent to       pathology in separate jars.      A TTS dilator was passed through the scope.      Dilation with a 15-16.5-18 mm balloon dilator was performed to 18 mm.       The dilation site was examined following endoscope reinsertion and       showed moderate mucosal disruption.      Food (residue) was found in the cardia, in the gastric fundus and in the       gastric  body.      The examined duodenum was normal. Impression:               - Food in the middle third of the esophagus.                            Removal was successful.                           - Esophageal mucosal changes consistent with                            eosinophilic esophagitis. Dilated.                           - Food (residue) in the stomach.                           - Normal examined duodenum.                           - Biopsies were taken with a cold forceps for                            evaluation of eosinophilic esophagitis. Moderate Sedation:      Patient did not receive moderate sedation for this procedure, but       instead received monitored anesthesia care. Recommendation:           - Advance diet as tolerated.                           - Continue present medications.                           - Await pathology results. Procedure Code(s):        --- Professional ---                           [phone], Esophagogastroduodenoscopy, flexible,                            transoral; with removal of foreign body(s)                           43249, Esophagogastroduodenoscopy, flexible,  transoral; with transendoscopic balloon dilation of                            esophagus (less than 30 mm diameter)                           43239, 59, Esophagogastroduodenoscopy, flexible,                            transoral; with biopsy, single or multiple Diagnosis Code(s):        --- Professional ---                           Z61.096E, Food in esophagus causing other injury,                            initial encounter                           K22.89, Other specified disease of esophagus                           T18.2XXA, Foreign body in stomach, initial encounter                           T18.108A, Unspecified foreign body in esophagus                            causing other injury, initial encounter CPT copyright 2022 [employer]. All  rights reserved. The codes documented in this report are preliminary and upon coder review may  be revised to meet current compliance requirements. Kerin Salen, MD 08/12/2023 5:38:00 PM This report has been signed electronically. Number of Addenda: 0

## 2023-08-14 ENCOUNTER — Encounter (HOSPITAL_COMMUNITY): Payer: Self-pay | Admitting: Gastroenterology

## 2023-08-14 LAB — SURGICAL PATHOLOGY

## 2023-09-05 DIAGNOSIS — F900 Attention-deficit hyperactivity disorder, predominantly inattentive type: Secondary | ICD-10-CM | POA: Diagnosis not present

## 2023-09-05 DIAGNOSIS — F411 Generalized anxiety disorder: Secondary | ICD-10-CM | POA: Diagnosis not present

## 2023-09-19 DIAGNOSIS — F411 Generalized anxiety disorder: Secondary | ICD-10-CM | POA: Diagnosis not present

## 2023-09-19 DIAGNOSIS — F900 Attention-deficit hyperactivity disorder, predominantly inattentive type: Secondary | ICD-10-CM | POA: Diagnosis not present

## 2023-09-24 DIAGNOSIS — R1319 Other dysphagia: Secondary | ICD-10-CM | POA: Diagnosis not present

## 2023-09-24 DIAGNOSIS — K219 Gastro-esophageal reflux disease without esophagitis: Secondary | ICD-10-CM | POA: Diagnosis not present

## 2023-09-24 DIAGNOSIS — K200 Eosinophilic esophagitis: Secondary | ICD-10-CM | POA: Diagnosis not present

## 2023-10-01 ENCOUNTER — Other Ambulatory Visit: Payer: Self-pay

## 2023-10-01 ENCOUNTER — Ambulatory Visit
Admission: EM | Admit: 2023-10-01 | Discharge: 2023-10-01 | Disposition: A | Discharge status: Home / Self Care | Payer: BC Managed Care – PPO | Attending: Family Medicine | Admitting: Family Medicine

## 2023-10-01 ENCOUNTER — Encounter: Payer: Self-pay | Admitting: Emergency Medicine

## 2023-10-01 DIAGNOSIS — J09X2 Influenza due to identified novel influenza A virus with other respiratory manifestations: Secondary | ICD-10-CM | POA: Diagnosis not present

## 2023-10-01 DIAGNOSIS — R051 Acute cough: Secondary | ICD-10-CM | POA: Diagnosis not present

## 2023-10-01 LAB — POC COVID19/FLU A&B COMBO
Covid Antigen, POC: NEGATIVE
Influenza A Antigen, POC: POSITIVE — AB
Influenza B Antigen, POC: NEGATIVE

## 2023-10-01 NOTE — Discharge Instructions (Addendum)
 Supportive Care Medications  These are medications that may help with your symptoms. However, Please note that if your PCP has told you not to use certain products please follow his/her recommendations.  For example: - if you have high BP you should use something similar to Coricidin HPB, not Sudafed or antihistamines with a "D" such as Allegra D. The "D" means decongestant.  (Afrin is safe to use with HBP, but do not use longer than 3 days) -Higher Doses of NSAIDS (Ibuprofen, Aleve etc) with Kidney disease or poorly controlled BP.  Fever, Body aches, Headache  Ibuprofen 200 mg 2 tablets and Acetaminophen 2 tabs 4 times a day just before meals and at bedtime (every 6 hours)    Do not use NSAIDS if you are allergic to NSAIDS, if you have been given oral steroids-prednisone, methylprednisolone, dexamethasone until your steroids are finished, if you are pregnant or breast feeding, or if you have history of kidney or liver disease.   Sore Throat: Cepacol throat lozenges or spray  Cough Drops Warm salt water gargles  How to make saltwater rinses Use warm water, because warmth is more relieving to a sore throat than cold water. Warm water will also help the salt dissolve into the water more effectively. Use any type of salt you have available. Most saltwater rinse recipes call for 8 ounces of warm water and 1 teaspoon of salt. However, if your mouth is tender and the saltwater rinse stings, decrease the salt to a 1/2 teaspoon for the first 1 to 2 days. Bring water to a boil, then remove from heat, add salt, and stir. Let the saltwater cool to a warm temperature before rinsing with it. Once you have finished your rinse, discard leftover solution to avoid contamination.  Nasal Congestion: Afrin (Oxymetazoline) 1 squirt in each side of nose 2 times daily for 3 days only.  Oral decongestants like sudafed but only if you do not have high Blood Pressure if you have high Blood Pressure take CORICIDIN  HBP Antihistamines Nasal Saline/Neti Pot  Cough: Expectorants (guaifenesin) help thin mucus making it easier to get up. This should be used during the day. During the day coughing helps bring mucus up out of your lungs Suppressants (dextromethorphan) just for bedtime so you can sleep   Oral rehydration is important when you have been sweating more than usual and/or having nausea and vomiting. You can use over the counter rehydration supplements like Pedialyte sport, Gatorlyte, Electrolit, Liquid IV, or you can make your own at home. Recipe:  Mix 1 liter of clean or boiled water with 6 teaspoons (2 tablespoons) of sugar and 1/2 tsp of salt. Stir until both dissolve.  You can add sugar free flavoring (i.e. Crystal light) if desired.  Drink small sips frequently rather than large amounts at once to prevent nausea.

## 2023-10-01 NOTE — ED Triage Notes (Signed)
Vomited 1 time today.  Has a cough, sneezing, head congestion, body aches, and right ear has a dull discomfort/pressure.    Patient has not taken any medicines for symptoms

## 2023-10-01 NOTE — ED Provider Notes (Signed)
[name]    CSN: [id number] Arrival date & time: 10/01/23  1848      History   Chief Complaint No chief complaint on file. Sick for 2 days symptoms include rhinorrhea, nasal congestion, cough, fever, nausea, body aches, stuffy nose, headache.  Multiple family members with similar symptoms.  Admits ear pain denies diarrhea, sore throat, recent travel.  Past medical history of asthma  HPI David Stevens is a 43 y.o. male.   The history is provided by the patient.    Past Medical History:  Diagnosis Date   Asthma    COVID-19 04/09/2022   Food allergy    Scoliosis    Seasonal allergies     There are no active problems to display for this patient.   Past Surgical History:  Procedure Laterality Date   BACK SURGERY     BALLOON DILATION N/A 08/12/2023   Procedure: BALLOON DILATION;  Surgeon: Kerin Salen, MD;  Location: [facility] ENDOSCOPY;  Service: Gastroenterology;  Laterality: N/A;   BIOPSY  10/22/2021   Procedure: BIOPSY;  Surgeon: Kathi Der, MD;  Location: [facility] ENDOSCOPY;  Service: Gastroenterology;;   ESOPHAGOGASTRODUODENOSCOPY (EGD) WITH PROPOFOL N/A 10/22/2021   Procedure: ESOPHAGOGASTRODUODENOSCOPY (EGD) WITH PROPOFOL;  Surgeon: Kathi Der, MD;  Location: [facility] ENDOSCOPY;  Service: Gastroenterology;  Laterality: N/A;   ESOPHAGOGASTRODUODENOSCOPY (EGD) WITH PROPOFOL N/A 08/12/2023   Procedure: ESOPHAGOGASTRODUODENOSCOPY (EGD) WITH PROPOFOL;  Surgeon: Kerin Salen, MD;  Location: [facility] ENDOSCOPY;  Service: Gastroenterology;  Laterality: N/A;   FOREIGN BODY REMOVAL  10/22/2021   Procedure: FOREIGN BODY REMOVAL;  Surgeon: Kathi Der, MD;  Location: [facility] ENDOSCOPY;  Service: Gastroenterology;;   FOREIGN BODY REMOVAL N/A 08/12/2023   Procedure: FOREIGN BODY REMOVAL;  Surgeon: Kerin Salen, MD;  Location: [facility] ENDOSCOPY;  Service: Gastroenterology;  Laterality: N/A;   TYMPANOSTOMY TUBE PLACEMENT     WISDOM TOOTH EXTRACTION         Home Medications     Prior to Admission medications   Medication Sig Start Date End Date Taking? Authorizing Provider  ADVAIR DISKUS 100-50 MCG/ACT AEPB Inhale 1 puff into the lungs 2 (two) times daily. 01/02/22   [provider]  esomeprazole (NEXIUM) 40 MG capsule Take 40 mg by mouth 2 (two) times daily. 03/09/22   [provider]  montelukast (SINGULAIR) 10 MG tablet SMARTSIG:1 Tablet(s) By Mouth Every Evening 01/11/22   [provider]  pantoprazole (PROTONIX) 40 MG tablet Take 1 tablet (40 mg total) by mouth 2 (two) times daily. 10/22/21 10/22/22  Kathi Der, MD    Family History No family history on file.  Social History Social History   Tobacco Use   Smoking status: Never  Substance Use Topics   Alcohol use: No   Drug use: No     Allergies   Shellfish allergy   Review of Systems Review of Systems  Constitutional:  Positive for chills, fatigue and fever.  HENT:  Positive for congestion, ear pain and rhinorrhea. Negative for sore throat.   Respiratory:  Positive for cough. Negative for shortness of breath.   Gastrointestinal:  Negative for nausea.  Neurological:  Positive for headaches.     Physical Exam Triage Vital Signs ED Triage Vitals [10/01/23 2007]  Encounter Vitals Group     BP 123/82     Systolic BP Percentile      Diastolic BP Percentile      Pulse Rate (!) 103     Resp 16     Temp 98.1 F (36.7 C)  Temp Source Oral     SpO2 97 %     Weight      Height      Head Circumference      Peak Flow      Pain Score      Pain Loc      Pain Education      Exclude from Growth Chart    No data found.  Updated Vital Signs BP 123/82 (BP Location: Right Arm)   Pulse (!) 103   Temp 98.1 F (36.7 C) (Oral)   Resp 16   SpO2 97%   Visual Acuity Right Eye Distance:   Left Eye Distance:   Bilateral Distance:    Right Eye Near:   Left Eye Near:    Bilateral Near:     Physical Exam Vitals and nursing note reviewed.  Constitutional:       Appearance: He is not ill-appearing.  HENT:     Head: Normocephalic and atraumatic.     Right Ear: Tympanic membrane and ear canal normal.     Left Ear: Tympanic membrane and ear canal normal.     Nose: No rhinorrhea.     Mouth/Throat:     Pharynx: Oropharynx is clear. No oropharyngeal exudate.  Eyes:     Conjunctiva/sclera: Conjunctivae normal.  Cardiovascular:     Rate and Rhythm: Normal rate and regular rhythm.     Heart sounds: Normal heart sounds.  Pulmonary:     Effort: Pulmonary effort is normal. No respiratory distress.     Breath sounds: Normal breath sounds. No wheezing or rales.  Musculoskeletal:     Cervical back: Neck supple.  Neurological:     Mental Status: He is alert and oriented to person, place, and time.  Psychiatric:        Mood and Affect: Mood normal.      UC Treatments / Results  Labs (all labs ordered are listed, but only abnormal results are displayed) Labs Reviewed  POC COVID19/FLU A&B COMBO    EKG   Radiology No results found.  Procedures Procedures (including critical care time)  Medications Ordered in UC Medications - No data to display  Initial Impression / Assessment and Plan / UC Course  I have reviewed the triage vital signs and the nursing notes.  Pertinent labs & imaging results that were available during my care of the patient were reviewed by me and considered in my medical decision making (see chart for details).     43 year old male with flulike symptoms for 2 days, mildly tachycardic, afebrile, nontoxic.  Point-of-care COVID is negative, point-of-care flu positive for influenza A. Final Clinical Impressions(s) / UC Diagnoses   Final diagnoses:  Acute cough   Discharge Instructions   None    ED Prescriptions   None    PDMP not reviewed this encounter.   Meliton Rattan, [place] 10/01/23 2022

## 2023-10-11 DIAGNOSIS — R062 Wheezing: Secondary | ICD-10-CM | POA: Diagnosis not present

## 2023-10-11 DIAGNOSIS — J069 Acute upper respiratory infection, unspecified: Secondary | ICD-10-CM | POA: Diagnosis not present

## 2023-10-11 DIAGNOSIS — J4530 Mild persistent asthma, uncomplicated: Secondary | ICD-10-CM | POA: Diagnosis not present

## 2023-10-23 DIAGNOSIS — F411 Generalized anxiety disorder: Secondary | ICD-10-CM | POA: Diagnosis not present

## 2023-10-23 DIAGNOSIS — F900 Attention-deficit hyperactivity disorder, predominantly inattentive type: Secondary | ICD-10-CM | POA: Diagnosis not present

## 2023-11-20 DIAGNOSIS — F900 Attention-deficit hyperactivity disorder, predominantly inattentive type: Secondary | ICD-10-CM | POA: Diagnosis not present

## 2023-11-20 DIAGNOSIS — F411 Generalized anxiety disorder: Secondary | ICD-10-CM | POA: Diagnosis not present

## 2023-12-05 DIAGNOSIS — F411 Generalized anxiety disorder: Secondary | ICD-10-CM | POA: Diagnosis not present

## 2023-12-05 DIAGNOSIS — F900 Attention-deficit hyperactivity disorder, predominantly inattentive type: Secondary | ICD-10-CM | POA: Diagnosis not present

## 2023-12-18 DIAGNOSIS — J4530 Mild persistent asthma, uncomplicated: Secondary | ICD-10-CM | POA: Diagnosis not present

## 2023-12-18 DIAGNOSIS — E782 Mixed hyperlipidemia: Secondary | ICD-10-CM | POA: Diagnosis not present

## 2023-12-18 DIAGNOSIS — K219 Gastro-esophageal reflux disease without esophagitis: Secondary | ICD-10-CM | POA: Diagnosis not present

## 2023-12-18 DIAGNOSIS — Z0000 Encounter for general adult medical examination without abnormal findings: Secondary | ICD-10-CM | POA: Diagnosis not present

## 2023-12-18 DIAGNOSIS — Z23 Encounter for immunization: Secondary | ICD-10-CM | POA: Diagnosis not present

## 2023-12-18 DIAGNOSIS — M419 Scoliosis, unspecified: Secondary | ICD-10-CM | POA: Diagnosis not present

## 2023-12-19 DIAGNOSIS — Z0000 Encounter for general adult medical examination without abnormal findings: Secondary | ICD-10-CM | POA: Diagnosis not present

## 2023-12-19 DIAGNOSIS — E782 Mixed hyperlipidemia: Secondary | ICD-10-CM | POA: Diagnosis not present

## 2023-12-25 DIAGNOSIS — F900 Attention-deficit hyperactivity disorder, predominantly inattentive type: Secondary | ICD-10-CM | POA: Diagnosis not present

## 2023-12-25 DIAGNOSIS — F411 Generalized anxiety disorder: Secondary | ICD-10-CM | POA: Diagnosis not present

## 2024-01-02 DIAGNOSIS — F900 Attention-deficit hyperactivity disorder, predominantly inattentive type: Secondary | ICD-10-CM | POA: Diagnosis not present

## 2024-01-02 DIAGNOSIS — F411 Generalized anxiety disorder: Secondary | ICD-10-CM | POA: Diagnosis not present

## 2024-01-16 DIAGNOSIS — F900 Attention-deficit hyperactivity disorder, predominantly inattentive type: Secondary | ICD-10-CM | POA: Diagnosis not present

## 2024-01-16 DIAGNOSIS — F411 Generalized anxiety disorder: Secondary | ICD-10-CM | POA: Diagnosis not present

## 2024-01-30 DIAGNOSIS — F900 Attention-deficit hyperactivity disorder, predominantly inattentive type: Secondary | ICD-10-CM | POA: Diagnosis not present

## 2024-01-30 DIAGNOSIS — F411 Generalized anxiety disorder: Secondary | ICD-10-CM | POA: Diagnosis not present

## 2024-01-31 DIAGNOSIS — M546 Pain in thoracic spine: Secondary | ICD-10-CM | POA: Diagnosis not present

## 2024-01-31 DIAGNOSIS — R519 Headache, unspecified: Secondary | ICD-10-CM | POA: Diagnosis not present

## 2024-01-31 DIAGNOSIS — R030 Elevated blood-pressure reading, without diagnosis of hypertension: Secondary | ICD-10-CM | POA: Diagnosis not present

## 2024-02-20 DIAGNOSIS — F900 Attention-deficit hyperactivity disorder, predominantly inattentive type: Secondary | ICD-10-CM | POA: Diagnosis not present

## 2024-02-25 DIAGNOSIS — K219 Gastro-esophageal reflux disease without esophagitis: Secondary | ICD-10-CM | POA: Diagnosis not present

## 2024-02-25 DIAGNOSIS — K200 Eosinophilic esophagitis: Secondary | ICD-10-CM | POA: Diagnosis not present

## 2024-02-25 DIAGNOSIS — R1319 Other dysphagia: Secondary | ICD-10-CM | POA: Diagnosis not present

## 2024-03-06 DIAGNOSIS — R21 Rash and other nonspecific skin eruption: Secondary | ICD-10-CM | POA: Diagnosis not present

## 2024-03-12 DIAGNOSIS — F411 Generalized anxiety disorder: Secondary | ICD-10-CM | POA: Diagnosis not present

## 2024-03-12 DIAGNOSIS — F900 Attention-deficit hyperactivity disorder, predominantly inattentive type: Secondary | ICD-10-CM | POA: Diagnosis not present

## 2024-05-05 DIAGNOSIS — K3189 Other diseases of stomach and duodenum: Secondary | ICD-10-CM | POA: Diagnosis not present

## 2024-05-05 DIAGNOSIS — K200 Eosinophilic esophagitis: Secondary | ICD-10-CM | POA: Diagnosis not present

## 2024-05-05 DIAGNOSIS — K222 Esophageal obstruction: Secondary | ICD-10-CM | POA: Diagnosis not present

## 2024-05-05 DIAGNOSIS — K449 Diaphragmatic hernia without obstruction or gangrene: Secondary | ICD-10-CM | POA: Diagnosis not present

## 2024-05-05 DIAGNOSIS — R1310 Dysphagia, unspecified: Secondary | ICD-10-CM | POA: Diagnosis not present

## 2024-05-05 DIAGNOSIS — K2289 Other specified disease of esophagus: Secondary | ICD-10-CM | POA: Diagnosis not present

## 2024-05-05 DIAGNOSIS — K209 Esophagitis, unspecified without bleeding: Secondary | ICD-10-CM | POA: Diagnosis not present

## 2024-05-08 DIAGNOSIS — J45909 Unspecified asthma, uncomplicated: Secondary | ICD-10-CM | POA: Diagnosis not present

## 2024-05-14 DIAGNOSIS — J209 Acute bronchitis, unspecified: Secondary | ICD-10-CM | POA: Diagnosis not present

## 2024-06-18 DIAGNOSIS — E782 Mixed hyperlipidemia: Secondary | ICD-10-CM | POA: Diagnosis not present

## 2024-06-18 DIAGNOSIS — K219 Gastro-esophageal reflux disease without esophagitis: Secondary | ICD-10-CM | POA: Diagnosis not present

## 2024-06-18 DIAGNOSIS — Z23 Encounter for immunization: Secondary | ICD-10-CM | POA: Diagnosis not present

## 2024-06-18 DIAGNOSIS — J4530 Mild persistent asthma, uncomplicated: Secondary | ICD-10-CM | POA: Diagnosis not present

## 2024-06-18 DIAGNOSIS — M419 Scoliosis, unspecified: Secondary | ICD-10-CM | POA: Diagnosis not present

## 2024-07-16 DIAGNOSIS — J069 Acute upper respiratory infection, unspecified: Secondary | ICD-10-CM | POA: Diagnosis not present
